# Patient Record
Sex: Female | Born: 1935 | Race: White | Hispanic: No | Smoking: Never smoker
Health system: Southern US, Community
[De-identification: ages and names within clinical notes are randomized; demographics above are authoritative.]

## PROBLEM LIST (undated history)

## (undated) DIAGNOSIS — C801 Malignant (primary) neoplasm, unspecified: Secondary | ICD-10-CM

## (undated) DIAGNOSIS — F32A Depression, unspecified: Secondary | ICD-10-CM

## (undated) DIAGNOSIS — Z8659 Personal history of other mental and behavioral disorders: Secondary | ICD-10-CM

## (undated) DIAGNOSIS — F0390 Unspecified dementia without behavioral disturbance: Secondary | ICD-10-CM

## (undated) DIAGNOSIS — F329 Major depressive disorder, single episode, unspecified: Secondary | ICD-10-CM

## (undated) DIAGNOSIS — H409 Unspecified glaucoma: Secondary | ICD-10-CM

## (undated) DIAGNOSIS — C73 Malignant neoplasm of thyroid gland: Secondary | ICD-10-CM

## (undated) DIAGNOSIS — R002 Palpitations: Secondary | ICD-10-CM

## (undated) DIAGNOSIS — E079 Disorder of thyroid, unspecified: Secondary | ICD-10-CM

## (undated) DIAGNOSIS — Z8619 Personal history of other infectious and parasitic diseases: Secondary | ICD-10-CM

## (undated) HISTORY — DX: Malignant neoplasm of thyroid gland: C73

## (undated) HISTORY — PX: APPENDECTOMY: SHX54

## (undated) HISTORY — DX: Unspecified glaucoma: H40.9

## (undated) HISTORY — DX: Disorder of thyroid, unspecified: E07.9

## (undated) HISTORY — PX: THYROIDECTOMY: SHX17

## (undated) HISTORY — DX: Personal history of other infectious and parasitic diseases: Z86.19

## (undated) HISTORY — DX: Malignant (primary) neoplasm, unspecified: C80.1

## (undated) HISTORY — DX: Palpitations: R00.2

## (undated) HISTORY — DX: Personal history of other mental and behavioral disorders: Z86.59

## (undated) HISTORY — DX: Major depressive disorder, single episode, unspecified: F32.9

## (undated) HISTORY — PX: ABDOMINAL HYSTERECTOMY: SHX81

## (undated) HISTORY — DX: Depression, unspecified: F32.A

---
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
## 1968-06-22 HISTORY — PX: LAPAROSCOPIC HYSTERECTOMY: SHX1926

## 2004-04-08 ENCOUNTER — Ambulatory Visit: Payer: Self-pay | Admitting: Internal Medicine

## 2005-01-22 ENCOUNTER — Ambulatory Visit: Payer: Self-pay | Admitting: Internal Medicine

## 2005-07-17 ENCOUNTER — Ambulatory Visit: Payer: Self-pay | Admitting: Unknown Physician Specialty

## 2006-03-04 ENCOUNTER — Ambulatory Visit: Payer: Self-pay | Admitting: Internal Medicine

## 2006-04-22 ENCOUNTER — Other Ambulatory Visit: Payer: Self-pay

## 2006-04-22 ENCOUNTER — Ambulatory Visit: Payer: Self-pay | Admitting: General Surgery

## 2006-05-05 ENCOUNTER — Inpatient Hospital Stay: Payer: Self-pay | Admitting: Surgery

## 2006-06-04 ENCOUNTER — Inpatient Hospital Stay: Payer: Self-pay | Admitting: Surgery

## 2006-08-06 ENCOUNTER — Ambulatory Visit: Payer: Self-pay | Admitting: Internal Medicine

## 2006-08-21 ENCOUNTER — Ambulatory Visit: Payer: Self-pay | Admitting: Internal Medicine

## 2006-08-30 ENCOUNTER — Ambulatory Visit: Payer: Self-pay | Admitting: Internal Medicine

## 2006-09-13 ENCOUNTER — Ambulatory Visit: Payer: Self-pay | Admitting: Internal Medicine

## 2006-09-21 ENCOUNTER — Ambulatory Visit: Payer: Self-pay | Admitting: Internal Medicine

## 2006-10-21 ENCOUNTER — Ambulatory Visit: Payer: Self-pay | Admitting: Internal Medicine

## 2006-12-21 ENCOUNTER — Ambulatory Visit: Payer: Self-pay | Admitting: Internal Medicine

## 2006-12-22 ENCOUNTER — Ambulatory Visit: Payer: Self-pay | Admitting: Internal Medicine

## 2007-01-21 ENCOUNTER — Ambulatory Visit: Payer: Self-pay | Admitting: Internal Medicine

## 2007-03-08 ENCOUNTER — Ambulatory Visit: Payer: Self-pay | Admitting: Internal Medicine

## 2007-03-10 ENCOUNTER — Ambulatory Visit: Payer: Self-pay | Admitting: Internal Medicine

## 2007-05-23 ENCOUNTER — Ambulatory Visit: Payer: Self-pay | Admitting: Internal Medicine

## 2007-06-20 ENCOUNTER — Ambulatory Visit: Payer: Self-pay | Admitting: Internal Medicine

## 2007-06-23 ENCOUNTER — Ambulatory Visit: Payer: Self-pay | Admitting: Internal Medicine

## 2007-09-06 ENCOUNTER — Ambulatory Visit: Payer: Self-pay | Admitting: Internal Medicine

## 2007-09-21 ENCOUNTER — Ambulatory Visit: Payer: Self-pay | Admitting: Internal Medicine

## 2007-11-08 ENCOUNTER — Ambulatory Visit: Payer: Self-pay | Admitting: Internal Medicine

## 2007-11-08 ENCOUNTER — Ambulatory Visit: Payer: Self-pay

## 2007-11-21 ENCOUNTER — Ambulatory Visit: Payer: Self-pay | Admitting: Internal Medicine

## 2007-12-21 ENCOUNTER — Ambulatory Visit: Payer: Self-pay | Admitting: Internal Medicine

## 2008-01-04 ENCOUNTER — Ambulatory Visit: Payer: Self-pay | Admitting: Internal Medicine

## 2008-01-21 ENCOUNTER — Ambulatory Visit: Payer: Self-pay | Admitting: Internal Medicine

## 2008-04-22 ENCOUNTER — Ambulatory Visit: Payer: Self-pay | Admitting: Internal Medicine

## 2008-04-25 ENCOUNTER — Ambulatory Visit: Payer: Self-pay | Admitting: Internal Medicine

## 2008-05-02 ENCOUNTER — Ambulatory Visit: Payer: Self-pay | Admitting: Internal Medicine

## 2008-05-22 ENCOUNTER — Ambulatory Visit: Payer: Self-pay | Admitting: Internal Medicine

## 2008-08-20 ENCOUNTER — Ambulatory Visit: Payer: Self-pay | Admitting: Internal Medicine

## 2008-08-29 ENCOUNTER — Ambulatory Visit: Payer: Self-pay | Admitting: Internal Medicine

## 2008-09-20 ENCOUNTER — Ambulatory Visit: Payer: Self-pay | Admitting: Internal Medicine

## 2008-12-17 ENCOUNTER — Ambulatory Visit: Payer: Self-pay | Admitting: Internal Medicine

## 2008-12-20 ENCOUNTER — Ambulatory Visit: Payer: Self-pay | Admitting: Internal Medicine

## 2009-04-22 ENCOUNTER — Ambulatory Visit: Payer: Self-pay | Admitting: Internal Medicine

## 2009-04-24 ENCOUNTER — Ambulatory Visit: Payer: Self-pay | Admitting: Internal Medicine

## 2009-05-09 ENCOUNTER — Ambulatory Visit: Payer: Self-pay | Admitting: Internal Medicine

## 2009-05-22 ENCOUNTER — Ambulatory Visit: Payer: Self-pay | Admitting: Internal Medicine

## 2009-12-20 ENCOUNTER — Ambulatory Visit: Payer: Self-pay | Admitting: Internal Medicine

## 2010-01-20 ENCOUNTER — Ambulatory Visit: Payer: Self-pay | Admitting: Internal Medicine

## 2010-04-23 ENCOUNTER — Ambulatory Visit: Payer: Self-pay | Admitting: Internal Medicine

## 2010-05-22 ENCOUNTER — Ambulatory Visit: Payer: Self-pay | Admitting: Internal Medicine

## 2010-06-25 ENCOUNTER — Ambulatory Visit: Payer: Self-pay | Admitting: Unknown Physician Specialty

## 2010-07-03 ENCOUNTER — Ambulatory Visit: Payer: Self-pay | Admitting: Internal Medicine

## 2010-07-16 ENCOUNTER — Ambulatory Visit: Payer: Self-pay | Admitting: Ophthalmology

## 2010-08-20 ENCOUNTER — Ambulatory Visit: Payer: Self-pay | Admitting: Ophthalmology

## 2010-08-22 ENCOUNTER — Ambulatory Visit: Payer: Self-pay | Admitting: Internal Medicine

## 2010-09-21 ENCOUNTER — Ambulatory Visit: Payer: Self-pay | Admitting: Internal Medicine

## 2010-12-23 ENCOUNTER — Ambulatory Visit: Payer: Self-pay | Admitting: Internal Medicine

## 2011-01-21 ENCOUNTER — Ambulatory Visit: Payer: Self-pay | Admitting: Internal Medicine

## 2011-04-24 ENCOUNTER — Ambulatory Visit: Payer: Self-pay | Admitting: Internal Medicine

## 2011-05-23 ENCOUNTER — Ambulatory Visit: Payer: Self-pay | Admitting: Internal Medicine

## 2011-06-09 LAB — BASIC METABOLIC PANEL
BUN: 17 mg/dL (ref 4–21)
CREATININE: 0.8 mg/dL (ref 0.5–1.1)
Glucose: 95 mg/dL
Potassium: 4.6 mmol/L (ref 3.4–5.3)
SODIUM: 141 mmol/L (ref 137–147)

## 2011-06-09 LAB — LIPID PANEL
Cholesterol: 217 mg/dL — AB (ref 0–200)
HDL: 64 mg/dL (ref 35–70)
LDL Cholesterol: 139 mg/dL
LDL/HDL RATIO: 3.4
Triglycerides: 71 mg/dL (ref 40–160)

## 2011-06-09 LAB — HEPATIC FUNCTION PANEL
ALT: 10 U/L (ref 7–35)
AST: 13 U/L (ref 13–35)
Alkaline Phosphatase: 49 U/L (ref 25–125)
BILIRUBIN DIRECT: 0.1 mg/dL (ref 0.01–0.4)
Bilirubin, Total: 0.5 mg/dL

## 2011-07-22 ENCOUNTER — Ambulatory Visit: Payer: Self-pay | Admitting: Internal Medicine

## 2011-07-22 LAB — HM MAMMOGRAPHY: HM Mammogram: NEGATIVE

## 2011-08-21 ENCOUNTER — Ambulatory Visit: Payer: Self-pay | Admitting: Internal Medicine

## 2011-08-21 LAB — TSH: Thyroid Stimulating Horm: 0.83 u[IU]/mL

## 2011-09-21 ENCOUNTER — Ambulatory Visit: Payer: Self-pay | Admitting: Internal Medicine

## 2011-12-22 DIAGNOSIS — R5383 Other fatigue: Secondary | ICD-10-CM | POA: Insufficient documentation

## 2011-12-25 ENCOUNTER — Other Ambulatory Visit: Payer: Self-pay | Admitting: Internal Medicine

## 2011-12-25 ENCOUNTER — Ambulatory Visit: Payer: Self-pay | Admitting: Internal Medicine

## 2011-12-25 LAB — CBC WITH DIFFERENTIAL/PLATELET
Basophil #: 0.1 10*3/uL (ref 0.0–0.1)
Eosinophil #: 0.2 10*3/uL (ref 0.0–0.7)
Eosinophil %: 3 %
Lymphocyte #: 2.2 10*3/uL (ref 1.0–3.6)
Lymphocyte %: 31 %
MCH: 30.7 pg (ref 26.0–34.0)
MCHC: 33.4 g/dL (ref 32.0–36.0)
MCV: 92 fL (ref 80–100)
Monocyte #: 0.5 x10 3/mm (ref 0.2–0.9)
Monocyte %: 7 %
Neutrophil %: 58.1 %
Platelet: 239 10*3/uL (ref 150–440)
RDW: 13.8 % (ref 11.5–14.5)
WBC: 6.9 10*3/uL (ref 3.6–11.0)

## 2011-12-25 LAB — COMPREHENSIVE METABOLIC PANEL
Albumin: 3.8 g/dL (ref 3.4–5.0)
Alkaline Phosphatase: 70 U/L (ref 50–136)
BUN: 13 mg/dL (ref 7–18)
Calcium, Total: 9 mg/dL (ref 8.5–10.1)
Chloride: 101 mmol/L (ref 98–107)
Creatinine: 0.79 mg/dL (ref 0.60–1.30)
EGFR (Non-African Amer.): >60
Glucose: 95 mg/dL (ref 65–99)
Potassium: 3.9 mmol/L (ref 3.5–5.1)
Sodium: 137 mmol/L (ref 136–145)

## 2012-01-21 ENCOUNTER — Ambulatory Visit: Payer: Self-pay | Admitting: Internal Medicine

## 2012-03-23 DIAGNOSIS — B0229 Other postherpetic nervous system involvement: Secondary | ICD-10-CM | POA: Insufficient documentation

## 2012-04-22 ENCOUNTER — Ambulatory Visit: Payer: Self-pay | Admitting: Internal Medicine

## 2012-04-22 LAB — CREATININE, SERUM
Creatinine: 0.92 mg/dL (ref 0.60–1.30)
EGFR (African American): >60
EGFR (Non-African Amer.): 59 — ABNORMAL LOW

## 2012-04-22 LAB — CBC CANCER CENTER
Basophil #: 0.1 x10 3/mm (ref 0.0–0.1)
Lymphocyte #: 2.7 x10 3/mm (ref 1.0–3.6)
Lymphocyte %: 33 %
MCH: 29.9 pg (ref 26.0–34.0)
MCV: 94 fL (ref 80–100)
Monocyte #: 0.7 x10 3/mm (ref 0.2–0.9)
Monocyte %: 8.2 %
Neutrophil %: 56.1 %
Platelet: 253 x10 3/mm (ref 150–440)
RDW: 15.4 % — ABNORMAL HIGH (ref 11.5–14.5)
WBC: 8.2 x10 3/mm (ref 3.6–11.0)

## 2012-05-22 ENCOUNTER — Ambulatory Visit: Payer: Self-pay | Admitting: Internal Medicine

## 2012-06-30 ENCOUNTER — Ambulatory Visit: Payer: Self-pay | Admitting: Internal Medicine

## 2012-07-28 DIAGNOSIS — M169 Osteoarthritis of hip, unspecified: Secondary | ICD-10-CM | POA: Insufficient documentation

## 2012-08-08 ENCOUNTER — Other Ambulatory Visit: Payer: Self-pay | Admitting: Internal Medicine

## 2012-08-08 LAB — CBC WITH DIFFERENTIAL/PLATELET
Basophil #: 0.1 10*3/uL (ref 0.0–0.1)
Basophil %: 0.8 %
Eosinophil #: 0.1 10*3/uL (ref 0.0–0.7)
HCT: 37 % (ref 35.0–47.0)
HGB: 12.2 g/dL (ref 12.0–16.0)
Lymphocyte #: 2 10*3/uL (ref 1.0–3.6)
Lymphocyte %: 30.6 %
MCH: 30.2 pg (ref 26.0–34.0)
MCHC: 32.9 g/dL (ref 32.0–36.0)
MCV: 92 fL (ref 80–100)
Monocyte #: 0.6 x10 3/mm (ref 0.2–0.9)
Neutrophil #: 3.9 10*3/uL (ref 1.4–6.5)
Neutrophil %: 58.6 %
Platelet: 253 10*3/uL (ref 150–440)
RDW: 14.4 % (ref 11.5–14.5)

## 2012-08-08 LAB — COMPREHENSIVE METABOLIC PANEL
Albumin: 3.5 g/dL (ref 3.4–5.0)
Anion Gap: 5 — ABNORMAL LOW (ref 7–16)
BUN: 18 mg/dL (ref 7–18)
Bilirubin,Total: 0.4 mg/dL (ref 0.2–1.0)
Calcium, Total: 8.9 mg/dL (ref 8.5–10.1)
Chloride: 104 mmol/L (ref 98–107)
Co2: 32 mmol/L (ref 21–32)
EGFR (African American): >60
EGFR (Non-African Amer.): >60
Osmolality: 283 (ref 275–301)
Potassium: 3.8 mmol/L (ref 3.5–5.1)
SGOT(AST): 17 U/L (ref 15–37)

## 2012-08-08 LAB — TSH: Thyroid Stimulating Horm: 0.952 u[IU]/mL

## 2013-01-18 ENCOUNTER — Encounter: Payer: Self-pay | Admitting: Internal Medicine

## 2013-01-20 ENCOUNTER — Encounter: Payer: Self-pay | Admitting: Internal Medicine

## 2013-01-23 ENCOUNTER — Telehealth: Payer: Self-pay | Admitting: Internal Medicine

## 2013-01-23 NOTE — Telephone Encounter (Signed)
I am having difficulty getting my regular patients in for their physicals and follow ups and especially for their acute problems.   I am unable to prescribe meds or write orders - prior to pts being seen.   Please notify PT of this information.  Thanks.

## 2013-01-23 NOTE — Telephone Encounter (Signed)
Pt is scheduled with Dr. Lorin Picket 09/2013 (due to 1 new Medicare pt monthly, appt made in May 2014). Pt previously saw Dr. Alison Murray.[facility] PT calling stating they will need an order to continue her PT before April 2015. Treated by [facility] PT for osteoarthritis, pain in joints, difficulty walking.  Pt will also need meds refilled.  Pt advised that she would need to see walk-in clinic for acute issues.  PT asking if there is anything we can do to continue this 79yo patient's care sooner than 09/2013.

## 2013-01-25 NOTE — Telephone Encounter (Signed)
I spoke with Harriett Sine and gave her the below information. She completely understands that Dr Lorin Picket is full at this time.  She will contact patient I did offer her an appointment with Raquel she will get back with [place] on that. She understands that we can't prescribe orders or meds prior to patient being seen.

## 2013-02-20 ENCOUNTER — Encounter: Payer: Self-pay | Admitting: Internal Medicine

## 2013-02-24 ENCOUNTER — Encounter: Payer: Self-pay | Admitting: Internal Medicine

## 2013-02-27 ENCOUNTER — Encounter: Payer: Self-pay | Admitting: *Deleted

## 2013-09-26 ENCOUNTER — Encounter: Payer: Self-pay | Admitting: Internal Medicine

## 2013-09-26 ENCOUNTER — Ambulatory Visit (INDEPENDENT_AMBULATORY_CARE_PROVIDER_SITE_OTHER): Payer: Medicare Other | Admitting: Internal Medicine

## 2013-09-26 ENCOUNTER — Encounter (INDEPENDENT_AMBULATORY_CARE_PROVIDER_SITE_OTHER): Payer: Self-pay

## 2013-09-26 VITALS — BP 110/70 | HR 75 | Temp 98.1°F | Ht 61.0 in | Wt 117.5 lb

## 2013-09-26 DIAGNOSIS — C73 Malignant neoplasm of thyroid gland: Secondary | ICD-10-CM

## 2013-09-26 DIAGNOSIS — M25551 Pain in right hip: Secondary | ICD-10-CM

## 2013-09-26 DIAGNOSIS — R51 Headache: Secondary | ICD-10-CM

## 2013-09-26 DIAGNOSIS — R002 Palpitations: Secondary | ICD-10-CM

## 2013-09-26 DIAGNOSIS — F32A Depression, unspecified: Secondary | ICD-10-CM

## 2013-09-26 DIAGNOSIS — Z13220 Encounter for screening for lipoid disorders: Secondary | ICD-10-CM

## 2013-09-26 DIAGNOSIS — F3289 Other specified depressive episodes: Secondary | ICD-10-CM

## 2013-09-26 DIAGNOSIS — M25559 Pain in unspecified hip: Secondary | ICD-10-CM

## 2013-09-26 DIAGNOSIS — R519 Headache, unspecified: Secondary | ICD-10-CM

## 2013-09-26 DIAGNOSIS — F329 Major depressive disorder, single episode, unspecified: Secondary | ICD-10-CM

## 2013-09-26 DIAGNOSIS — H409 Unspecified glaucoma: Secondary | ICD-10-CM

## 2013-09-26 NOTE — Progress Notes (Signed)
Pre-visit discussion using our clinic review tool. No additional management support is needed unless otherwise documented below in the visit note.  

## 2013-10-01 ENCOUNTER — Encounter: Payer: Self-pay | Admitting: Internal Medicine

## 2013-10-01 DIAGNOSIS — C73 Malignant neoplasm of thyroid gland: Secondary | ICD-10-CM | POA: Insufficient documentation

## 2013-10-01 DIAGNOSIS — F329 Major depressive disorder, single episode, unspecified: Secondary | ICD-10-CM | POA: Insufficient documentation

## 2013-10-01 DIAGNOSIS — H409 Unspecified glaucoma: Secondary | ICD-10-CM | POA: Insufficient documentation

## 2013-10-01 DIAGNOSIS — R51 Headache: Secondary | ICD-10-CM

## 2013-10-01 DIAGNOSIS — R519 Headache, unspecified: Secondary | ICD-10-CM | POA: Insufficient documentation

## 2013-10-01 DIAGNOSIS — M25559 Pain in unspecified hip: Secondary | ICD-10-CM | POA: Insufficient documentation

## 2013-10-01 DIAGNOSIS — M25551 Pain in right hip: Secondary | ICD-10-CM | POA: Insufficient documentation

## 2013-10-01 DIAGNOSIS — R002 Palpitations: Secondary | ICD-10-CM | POA: Insufficient documentation

## 2013-10-01 DIAGNOSIS — F32A Depression, unspecified: Secondary | ICD-10-CM | POA: Insufficient documentation

## 2013-10-01 NOTE — Assessment & Plan Note (Signed)
Had holter.  No significant arrhythmia.  Sees Dr Saralyn Pilar.  Stable.

## 2013-10-01 NOTE — Assessment & Plan Note (Signed)
On thyroid replacement now.  Follow.  Check tsh.

## 2013-10-01 NOTE — Assessment & Plan Note (Signed)
Describes over the last 2-3 weeks noticing a shooting pain - right side.  Only lasts for seconds.  No headache. Offered further testing.  She declines at this time.  Wants to follow.  Will check ESR.

## 2013-10-01 NOTE — Assessment & Plan Note (Signed)
On celexa and doing well.  Follow.

## 2013-10-01 NOTE — Assessment & Plan Note (Signed)
Sees opthalmology.  On drops.    

## 2013-10-01 NOTE — Progress Notes (Signed)
Subjective:    Patient ID: Paige Farmer, female    DOB: 1931/03/12, 80 y.o.   MRN: [id number]  HPI 80 year old female with past history of palpitations, glaucoma, depression and thyroid cancer.  She comes in today to follow up on these issues as well as to establish care.  She is a former pt of Dr Heber .  States overall she feels she is doing well.  Does report some occasional fluttering.  Sees Dr Saralyn Pilar every 6 months.  Has had a holter.  She uses drops for her glaucoma.  Sees opthalmology regularly.  On celexa 20mg  q day.  No significant depression symptoms.  Eating and drinking well.  She does report noticing a sharp pain - right side of her head.  Is intermittent.  Started 2-3 weeks ago.  Only last for second or two then resolves.  No headache.  No chest pain or tightness.  Breathing stable.  Does report some minimal constipation.  Takes a stool softener.  Controls.  Some right hip discomfort at times.  States has been told has arthritis.     Past Medical History  Diagnosis Date  . H/O: depression   . History of chicken pox   . Glaucoma   . Thyroid cancer   . Depression   . Thyroid disease   . Palpitations     Outpatient Encounter Prescriptions as of 09/26/2013  Medication Sig  . aspirin EC 81 MG tablet Take 81 mg by mouth daily.  . Calcium Carb-Cholecalciferol (CALCIUM 600 + D PO) Take by mouth 2 (two) times daily.  Marland Kitchen levothyroxine (SYNTHROID, LEVOTHROID) 75 MCG tablet Take 75 mcg by mouth daily before breakfast.  . liothyronine (CYTOMEL) 5 MCG tablet Take 5 mcg by mouth daily.  . Multiple Vitamins-Minerals (CENTRUM) tablet Take 1 tablet by mouth daily.  . Multiple Vitamins-Minerals (PRESERVISION/LUTEIN) CAPS Take by mouth 2 (two) times daily.  . timolol (BETIMOL) 0.5 % ophthalmic solution Place 1 drop into both eyes 2 (two) times daily.    Review of Systems Patient denies any headache, lightheadedness or dizziness.  Does report the sharp head pain as outlined.  No vision  change.  No chest pain, tightness or significant palpitations.  Occasionally may notice a few palpitations.  No increased shortness of breath, cough or congestion.  No nausea or vomiting.  No abdominal pain or cramping.  No bowel change, such as diarrhea, BRBPR or melana.  Some minimal constipation.  Takes stool softener.  No urine change.  Right hip pain as outlined.  Doing well on celexa.         Objective:   Physical Exam Filed Vitals:   09/26/13 1440  BP: 110/70  Pulse: 75  Temp: 98.1 F (36.7 C)   Blood pressure recheck:  120/62, pulse 14  80 year old female in no acute distress.   HEENT:  Nares- clear.  Oropharynx - without lesions. NECK:  Supple.  Nontender.  No audible bruit.  HEART:  Appears to be regular. LUNGS:  No crackles or wheezing audible.  Respirations even and unlabored.  RADIAL PULSE:  Equal bilaterally.  ABDOMEN:  Soft, nontender.  Bowel sounds present and normal.  No audible abdominal bruit.   EXTREMITIES:  No increased edema present.  DP pulses palpable and equal bilaterally.          Assessment & Plan:  HEALTH MAINTENANCE.  Schedule her for a physical.  Had a colonoscopy just over one year ago.  Up to date with  mammogram.  Obtain records.    I spent 45 minutes with the patient and more than 50% of the time was spent in consultation regarding the above.

## 2013-10-01 NOTE — Assessment & Plan Note (Signed)
Tylenol as directed.  Follow.  Does not limit her walking.  Will notify me if persistent.

## 2013-10-05 ENCOUNTER — Encounter: Payer: Self-pay | Admitting: Internal Medicine

## 2013-10-05 ENCOUNTER — Other Ambulatory Visit (INDEPENDENT_AMBULATORY_CARE_PROVIDER_SITE_OTHER): Payer: Medicare Other

## 2013-10-05 DIAGNOSIS — C73 Malignant neoplasm of thyroid gland: Secondary | ICD-10-CM

## 2013-10-05 DIAGNOSIS — R002 Palpitations: Secondary | ICD-10-CM

## 2013-10-05 DIAGNOSIS — Z136 Encounter for screening for cardiovascular disorders: Secondary | ICD-10-CM

## 2013-10-05 DIAGNOSIS — Z13220 Encounter for screening for lipoid disorders: Secondary | ICD-10-CM

## 2013-10-05 LAB — CBC WITH DIFFERENTIAL/PLATELET
Basophils Absolute: 0.1 10*3/uL (ref 0.0–0.1)
Basophils Relative: 0.8 % (ref 0.0–3.0)
EOS PCT: 3.1 % (ref 0.0–5.0)
Eosinophils Absolute: 0.2 10*3/uL (ref 0.0–0.7)
HEMATOCRIT: 36.2 % (ref 36.0–46.0)
Hemoglobin: 11.8 g/dL — ABNORMAL LOW (ref 12.0–15.0)
LYMPHS ABS: 2.9 10*3/uL (ref 0.7–4.0)
Lymphocytes Relative: 41.1 % (ref 12.0–46.0)
MCHC: 32.7 g/dL (ref 30.0–36.0)
MCV: 91.6 fl (ref 78.0–100.0)
Monocytes Absolute: 0.6 10*3/uL (ref 0.1–1.0)
Monocytes Relative: 8.8 % (ref 3.0–12.0)
Neutro Abs: 3.3 10*3/uL (ref 1.4–7.7)
Neutrophils Relative %: 46.2 % (ref 43.0–77.0)
PLATELETS: 251 10*3/uL (ref 150.0–400.0)
RBC: 3.96 Mil/uL (ref 3.87–5.11)
RDW: 14.4 % (ref 11.5–14.6)
WBC: 7.2 10*3/uL (ref 4.5–10.5)

## 2013-10-05 LAB — COMPREHENSIVE METABOLIC PANEL
ALK PHOS: 51 U/L (ref 39–117)
ALT: 16 U/L (ref 0–35)
AST: 19 U/L (ref 0–37)
Albumin: 3.7 g/dL (ref 3.5–5.2)
BUN: 16 mg/dL (ref 6–23)
CO2: 31 mEq/L (ref 19–32)
CREATININE: 0.7 mg/dL (ref 0.4–1.2)
Calcium: 9.3 mg/dL (ref 8.4–10.5)
Chloride: 100 mEq/L (ref 96–112)
GFR: 82.63 mL/min (ref >60.00)
GLUCOSE: 93 mg/dL (ref 70–99)
Potassium: 4.2 mEq/L (ref 3.5–5.1)
SODIUM: 137 meq/L (ref 135–145)
TOTAL PROTEIN: 6.2 g/dL (ref 6.0–8.3)
Total Bilirubin: 0.7 mg/dL (ref 0.3–1.2)

## 2013-10-05 LAB — LIPID PANEL
CHOLESTEROL: 219 mg/dL — AB (ref 0–200)
HDL: 65.5 mg/dL (ref >39.00)
LDL Cholesterol: 145 mg/dL — ABNORMAL HIGH (ref 0–99)
TRIGLYCERIDES: 42 mg/dL (ref 0.0–149.0)
Total CHOL/HDL Ratio: 3
VLDL: 8.4 mg/dL (ref 0.0–40.0)

## 2013-10-05 LAB — TSH: TSH: 0.33 u[IU]/mL — AB (ref 0.35–5.50)

## 2013-10-06 ENCOUNTER — Other Ambulatory Visit: Payer: Self-pay | Admitting: Internal Medicine

## 2013-10-06 ENCOUNTER — Encounter: Payer: Self-pay | Admitting: Internal Medicine

## 2013-10-06 DIAGNOSIS — D649 Anemia, unspecified: Secondary | ICD-10-CM

## 2013-10-06 NOTE — Progress Notes (Signed)
Orders placed for f/u labs.  

## 2013-10-09 ENCOUNTER — Telehealth: Payer: Self-pay | Admitting: Internal Medicine

## 2013-10-09 ENCOUNTER — Other Ambulatory Visit: Payer: Self-pay | Admitting: *Deleted

## 2013-10-09 MED ORDER — LEVOTHYROXINE SODIUM 50 MCG PO TABS: 50.0000 ug | ORAL_TABLET | Freq: Every day | ORAL | Status: DC

## 2013-10-09 NOTE — Telephone Encounter (Signed)
I sent her a my chart message regarding this. She is calling back.   I do not know when she got these vaccines, so I am not sure which ones are due.  If she knows dates of when she had her previous vaccines and colonoscopy, then we can arrange a nurse visit to get the ones she is needing.

## 2013-10-09 NOTE — Telephone Encounter (Signed)
Sent pt another mychart message

## 2013-10-09 NOTE — Telephone Encounter (Signed)
Please advise 

## 2013-10-09 NOTE — Telephone Encounter (Signed)
See below my chart message is it ok to make nurse visit  Appointment Request From: [name]      With Provider: Alisa Graff, MD [-Primary Care Physician-]      Preferred Date Range: From 10/06/2013 To 10/29/2013      Preferred Times: Monday Morning, Wednesday Morning, Friday Morning      Reason: To address the following health maintenance concerns.   Tetanus/Tdap   Colonoscopy   Zostavax   Pneumococcal Polysaccharide Vaccine Age 78 And Over      Comments:

## 2013-10-11 ENCOUNTER — Other Ambulatory Visit (INDEPENDENT_AMBULATORY_CARE_PROVIDER_SITE_OTHER): Payer: Medicare Other

## 2013-10-11 DIAGNOSIS — D649 Anemia, unspecified: Secondary | ICD-10-CM

## 2013-10-11 LAB — CBC WITH DIFFERENTIAL/PLATELET
BASOS ABS: 0.1 10*3/uL (ref 0.0–0.1)
Basophils Relative: 0.8 % (ref 0.0–3.0)
EOS ABS: 0.2 10*3/uL (ref 0.0–0.7)
Eosinophils Relative: 2.5 % (ref 0.0–5.0)
HCT: 34.6 % — ABNORMAL LOW (ref 36.0–46.0)
Hemoglobin: 11.5 g/dL — ABNORMAL LOW (ref 12.0–15.0)
LYMPHS ABS: 2.5 10*3/uL (ref 0.7–4.0)
Lymphocytes Relative: 32.4 % (ref 12.0–46.0)
MCHC: 33.2 g/dL (ref 30.0–36.0)
MCV: 91.3 fl (ref 78.0–100.0)
MONO ABS: 0.7 10*3/uL (ref 0.1–1.0)
Monocytes Relative: 9.6 % (ref 3.0–12.0)
NEUTROS PCT: 54.7 % (ref 43.0–77.0)
Neutro Abs: 4.2 10*3/uL (ref 1.4–7.7)
PLATELETS: 231 10*3/uL (ref 150.0–400.0)
RBC: 3.79 Mil/uL — ABNORMAL LOW (ref 3.87–5.11)
RDW: 14.7 % — AB (ref 11.5–14.6)
WBC: 7.7 10*3/uL (ref 4.5–10.5)

## 2013-10-11 LAB — IBC PANEL
Iron: 72 ug/dL (ref 42–145)
Saturation Ratios: 25 % (ref 20.0–50.0)
Transferrin: 205.9 mg/dL — ABNORMAL LOW (ref 212.0–360.0)

## 2013-10-11 LAB — VITAMIN B12: Vitamin B-12: 766 pg/mL (ref 211–911)

## 2013-10-11 LAB — FERRITIN: FERRITIN: 30.1 ng/mL (ref 10.0–291.0)

## 2013-10-12 ENCOUNTER — Telehealth: Payer: Self-pay | Admitting: Internal Medicine

## 2013-10-12 ENCOUNTER — Encounter: Payer: Self-pay | Admitting: Internal Medicine

## 2013-10-12 DIAGNOSIS — D649 Anemia, unspecified: Secondary | ICD-10-CM

## 2013-10-12 NOTE — Telephone Encounter (Signed)
Pt notified of lab results via my chart.  Needs a non fasting lab appt in 3-4 weeks.  Please schedule the lab appt and notify pt of appt date and tme.  Thanks.  Dr Nicki Reaper

## 2013-10-12 NOTE — Telephone Encounter (Signed)
Appointment 11/07/13  Sent my chart message letting pt know about appointment

## 2013-10-20 ENCOUNTER — Encounter: Payer: Self-pay | Admitting: Internal Medicine

## 2013-10-20 ENCOUNTER — Ambulatory Visit (INDEPENDENT_AMBULATORY_CARE_PROVIDER_SITE_OTHER)
Admission: RE | Admit: 2013-10-20 | Discharge: 2013-10-20 | Disposition: A | Discharge status: Home / Self Care | Payer: Medicare Other | Source: Ambulatory Visit | Attending: Internal Medicine | Admitting: Internal Medicine

## 2013-10-20 ENCOUNTER — Ambulatory Visit (INDEPENDENT_AMBULATORY_CARE_PROVIDER_SITE_OTHER): Payer: Medicare Other | Admitting: Internal Medicine

## 2013-10-20 VITALS — BP 130/70 | HR 64 | Temp 97.9°F | Ht 61.0 in | Wt 118.8 lb

## 2013-10-20 DIAGNOSIS — M25559 Pain in unspecified hip: Secondary | ICD-10-CM

## 2013-10-20 DIAGNOSIS — F3289 Other specified depressive episodes: Secondary | ICD-10-CM

## 2013-10-20 DIAGNOSIS — R519 Headache, unspecified: Secondary | ICD-10-CM

## 2013-10-20 DIAGNOSIS — M25551 Pain in right hip: Secondary | ICD-10-CM

## 2013-10-20 DIAGNOSIS — C73 Malignant neoplasm of thyroid gland: Secondary | ICD-10-CM

## 2013-10-20 DIAGNOSIS — F329 Major depressive disorder, single episode, unspecified: Secondary | ICD-10-CM

## 2013-10-20 DIAGNOSIS — F32A Depression, unspecified: Secondary | ICD-10-CM

## 2013-10-20 DIAGNOSIS — R51 Headache: Secondary | ICD-10-CM

## 2013-10-20 NOTE — Progress Notes (Signed)
Pre visit review using our clinic review tool, if applicable. No additional management support is needed unless otherwise documented below in the visit note. 

## 2013-10-22 ENCOUNTER — Encounter: Payer: Self-pay | Admitting: Internal Medicine

## 2013-10-22 NOTE — Assessment & Plan Note (Signed)
Persistent pain.  Will check right hip xray.  Further w/up pending results.

## 2013-10-22 NOTE — Assessment & Plan Note (Signed)
Not reported as a problem today.  Follow.   

## 2013-10-22 NOTE — Assessment & Plan Note (Signed)
On celexa and doing well.  Follow.   

## 2013-10-22 NOTE — Assessment & Plan Note (Signed)
On thyroid replacement now.  Follow.  Just adjusted her thyroid medication.  Check tsh in several weeks.

## 2013-10-22 NOTE — Progress Notes (Signed)
Subjective:    Patient ID: Paige Farmer, female    DOB: 08-29-1930, 80 y.o.   MRN: [id number]  HPI 80 year old female with past history of palpitations, glaucoma, depression and thyroid cancer.  She comes in today for a follow up.  States overall she feels she is doing well.  Sees Dr Saralyn Pilar every 6 months.  Has had a holter.  She uses drops for her glaucoma.  Sees opthalmology regularly.  On celexa 20mg  q day.  No significant depression symptoms.  Eating and drinking well.   No headache.  No head pain reported.  No chest pain or tightness.  Breathing stable.  Does report some minimal constipation.  Takes a stool softener.  Controls.  Has right hip pain.  Present for a couple of years.  Flares more at times.  If she lies down a certain way, ok.  Discussed her immunizations.  Do not have the records from Dr Heber [place].  Unsure when had her previous immunizations.     Past Medical History  Diagnosis Date  . H/O: depression   . History of chicken pox   . Glaucoma   . Thyroid cancer   . Depression   . Thyroid disease   . Palpitations     Outpatient Encounter Prescriptions as of 10/20/2013  Medication Sig  . aspirin EC 81 MG tablet Take 81 mg by mouth daily.  . Calcium Carb-Cholecalciferol (CALCIUM 600 + D PO) Take by mouth 2 (two) times daily.  Marland Kitchen levothyroxine (SYNTHROID, LEVOTHROID) 50 MCG tablet Take 1 tablet (50 mcg total) by mouth daily.  Marland Kitchen liothyronine (CYTOMEL) 5 MCG tablet Take 5 mcg by mouth daily.  . Multiple Vitamins-Minerals (CENTRUM) tablet Take 1 tablet by mouth daily.  . Multiple Vitamins-Minerals (PRESERVISION/LUTEIN) CAPS Take by mouth 2 (two) times daily.  . timolol (BETIMOL) 0.5 % ophthalmic solution Place 1 drop into both eyes 2 (two) times daily.    Review of Systems Patient denies any headache, lightheadedness or dizziness.  She reported no head pain.  No vision change.  No chest pain, tightness or significant palpitations.  No increased shortness of breath, cough  or congestion.  No nausea or vomiting.  No abdominal pain or cramping.  No bowel change, such as diarrhea, BRBPR or melana.  Some minimal constipation.  Takes stool softener.  No urine change.  Right hip pain as outlined.  Doing well on celexa.         Objective:   Physical Exam  Filed Vitals:   10/20/13 0917  BP: 130/70  Pulse: 64  Temp: 97.9 F (42.84 C)   80 year old female in no acute distress.   HEENT:  Nares- clear.  Oropharynx - without lesions. NECK:  Supple.  Nontender.  No audible bruit.  HEART:  Appears to be regular. LUNGS:  No crackles or wheezing audible.  Respirations even and unlabored.  RADIAL PULSE:  Equal bilaterally.  ABDOMEN:  Soft, nontender.  Bowel sounds present and normal.  No audible abdominal bruit.   EXTREMITIES:  No increased edema present.  DP pulses palpable and equal bilaterally.    MSK:  Minimal right lateral hip pain with full extension of her right leg.  Minimal pain with abduction and adduction right leg.       Assessment & Plan:  IMMUNIZATIONS.  Need outside records for review.    HEALTH MAINTENANCE.  Scheduled for a physical.  Had a colonoscopy just over one year ago.  Up to date with  mammogram.  Still need records.   I spent 25 minutes with the patient and more than 50% of the time was spent in consultation regarding the above.

## 2013-10-23 ENCOUNTER — Encounter: Payer: Self-pay | Admitting: Internal Medicine

## 2013-10-23 DIAGNOSIS — M25559 Pain in unspecified hip: Secondary | ICD-10-CM

## 2013-10-23 NOTE — Telephone Encounter (Signed)
Order placed for physical therapy referral.

## 2013-10-24 ENCOUNTER — Encounter: Payer: Self-pay | Admitting: Internal Medicine

## 2013-10-31 ENCOUNTER — Encounter: Payer: Self-pay | Admitting: Internal Medicine

## 2013-11-07 ENCOUNTER — Encounter: Payer: Self-pay | Admitting: Internal Medicine

## 2013-11-07 ENCOUNTER — Other Ambulatory Visit (INDEPENDENT_AMBULATORY_CARE_PROVIDER_SITE_OTHER): Payer: Medicare Other

## 2013-11-07 DIAGNOSIS — D649 Anemia, unspecified: Secondary | ICD-10-CM

## 2013-11-07 LAB — CBC WITH DIFFERENTIAL/PLATELET
Basophils Absolute: 0 10*3/uL (ref 0.0–0.1)
Basophils Relative: 0.5 % (ref 0.0–3.0)
EOS PCT: 2.8 % (ref 0.0–5.0)
Eosinophils Absolute: 0.2 10*3/uL (ref 0.0–0.7)
HCT: 34.6 % — ABNORMAL LOW (ref 36.0–46.0)
Hemoglobin: 11.5 g/dL — ABNORMAL LOW (ref 12.0–15.0)
LYMPHS PCT: 36.1 % (ref 12.0–46.0)
Lymphs Abs: 2.5 10*3/uL (ref 0.7–4.0)
MCHC: 33.2 g/dL (ref 30.0–36.0)
MCV: 91.8 fl (ref 78.0–100.0)
MONO ABS: 0.5 10*3/uL (ref 0.1–1.0)
MONOS PCT: 7.3 % (ref 3.0–12.0)
NEUTROS PCT: 53.3 % (ref 43.0–77.0)
Neutro Abs: 3.7 10*3/uL (ref 1.4–7.7)
PLATELETS: 226 10*3/uL (ref 150.0–400.0)
RBC: 3.77 Mil/uL — ABNORMAL LOW (ref 3.87–5.11)
RDW: 14.5 % (ref 11.5–15.5)
WBC: 6.9 10*3/uL (ref 4.0–10.5)

## 2013-11-29 ENCOUNTER — Telehealth: Payer: Self-pay | Admitting: *Deleted

## 2013-11-29 MED ORDER — CITALOPRAM HYDROBROMIDE 20 MG PO TABS: 20.0000 mg | ORAL_TABLET | Freq: Every day | ORAL | Status: DC

## 2013-11-29 NOTE — Telephone Encounter (Signed)
If pt has been taking regularly - yes ok to refill x 4. For some reason,  I have this written in my note, but not on med list.  Thanks

## 2013-11-29 NOTE — Telephone Encounter (Signed)
Ok refill, not on med list? See [employer]

## 2013-11-29 NOTE — Telephone Encounter (Signed)
Sent mychart message, medication not currently on patient's list

## 2013-11-29 NOTE — Telephone Encounter (Signed)
Refill Request  Citalopram 20 mg

## 2013-11-29 NOTE — Telephone Encounter (Signed)
Rx sent to pharmacy by escript  

## 2013-12-19 ENCOUNTER — Encounter: Payer: Self-pay | Admitting: Internal Medicine

## 2013-12-29 ENCOUNTER — Ambulatory Visit (INDEPENDENT_AMBULATORY_CARE_PROVIDER_SITE_OTHER): Payer: Medicare Other | Admitting: Internal Medicine

## 2013-12-29 ENCOUNTER — Encounter: Payer: Self-pay | Admitting: Internal Medicine

## 2013-12-29 VITALS — BP 140/60 | HR 62 | Temp 98.5°F | Ht 61.5 in | Wt 119.2 lb

## 2013-12-29 DIAGNOSIS — R51 Headache: Secondary | ICD-10-CM

## 2013-12-29 DIAGNOSIS — H409 Unspecified glaucoma: Secondary | ICD-10-CM

## 2013-12-29 DIAGNOSIS — R519 Headache, unspecified: Secondary | ICD-10-CM

## 2013-12-29 DIAGNOSIS — K5900 Constipation, unspecified: Secondary | ICD-10-CM

## 2013-12-29 DIAGNOSIS — F32A Depression, unspecified: Secondary | ICD-10-CM

## 2013-12-29 DIAGNOSIS — E7800 Pure hypercholesterolemia, unspecified: Secondary | ICD-10-CM

## 2013-12-29 DIAGNOSIS — F3289 Other specified depressive episodes: Secondary | ICD-10-CM

## 2013-12-29 DIAGNOSIS — F329 Major depressive disorder, single episode, unspecified: Secondary | ICD-10-CM

## 2013-12-29 DIAGNOSIS — D649 Anemia, unspecified: Secondary | ICD-10-CM

## 2013-12-29 DIAGNOSIS — C73 Malignant neoplasm of thyroid gland: Secondary | ICD-10-CM

## 2013-12-29 MED ORDER — LEVOTHYROXINE SODIUM 50 MCG PO TABS: 50.0000 ug | ORAL_TABLET | Freq: Every day | ORAL | Status: DC

## 2013-12-29 MED ORDER — SERTRALINE HCL 50 MG PO TABS: 50.0000 mg | ORAL_TABLET | Freq: Every day | ORAL | Status: DC

## 2013-12-29 NOTE — Patient Instructions (Signed)
Take citalopram 20mg  1/2 tablet per day for one week and then stop.    Start zoloft 50mg  1/2 tablet per day for one week and then increase to one whole tablet per day.

## 2013-12-29 NOTE — Progress Notes (Signed)
Pre visit review using our clinic review tool, if applicable. No additional management support is needed unless otherwise documented below in the visit note. 

## 2014-01-04 ENCOUNTER — Encounter: Payer: Self-pay | Admitting: Internal Medicine

## 2014-01-04 DIAGNOSIS — K5900 Constipation, unspecified: Secondary | ICD-10-CM | POA: Insufficient documentation

## 2014-01-04 DIAGNOSIS — D649 Anemia, unspecified: Secondary | ICD-10-CM | POA: Insufficient documentation

## 2014-01-04 NOTE — Assessment & Plan Note (Signed)
Sees opthalmology.  On drops.

## 2014-01-04 NOTE — Progress Notes (Signed)
Subjective:    Patient ID: Paige Farmer, female    DOB: Nov 18, 1930, 80 y.o.   MRN: [id number]  HPI 80 year old female with past history of palpitations, glaucoma, depression and thyroid cancer.  She comes in today to follow up on these issues as well as for a complete physical exam.  States overall she feels she is doing well.  Sees Dr Saralyn Pilar every 6 months.  Has had a holter.  She uses drops for her glaucoma.  Sees opthalmology regularly.  On celexa 20mg  q day.  She is not sleeping as well.  Some increased stress and some increased depression (minimal).  Would like to change the citalopram to something different.   Eating and drinking well.   No headache.  No head pain reported.  No chest pain or tightness.  Breathing stable.  Does report was having minimal constipation.  Takes a stool softener bid and this is working for her.   Again discussed her immunizations.  Do not have the records from Dr Heber [place].  Unsure when had her previous immunizations.     Past Medical History  Diagnosis Date  . H/O: depression   . History of chicken pox   . Glaucoma   . Thyroid cancer   . Depression   . Thyroid disease   . Palpitations     Outpatient Encounter Prescriptions as of 12/29/2013  Medication Sig  . aspirin EC 81 MG tablet Take 81 mg by mouth daily.  . Calcium Carb-Cholecalciferol (CALCIUM 600 + D PO) Take by mouth 2 (two) times daily.  . citalopram (CELEXA) 20 MG tablet Take 1 tablet (20 mg total) by mouth daily.  Marland Kitchen levothyroxine (SYNTHROID, LEVOTHROID) 50 MCG tablet Take 1 tablet (50 mcg total) by mouth daily.  Marland Kitchen liothyronine (CYTOMEL) 5 MCG tablet Take 5 mcg by mouth daily.  . Multiple Vitamins-Minerals (CENTRUM) tablet Take 1 tablet by mouth daily.  . Multiple Vitamins-Minerals (PRESERVISION/LUTEIN) CAPS Take by mouth 2 (two) times daily.  . sertraline (ZOLOFT) 50 MG tablet Take 1 tablet (50 mg total) by mouth daily.  . timolol (BETIMOL) 0.5 % ophthalmic solution Place 1 drop into  both eyes 2 (two) times daily.  . [DISCONTINUED] levothyroxine (SYNTHROID, LEVOTHROID) 50 MCG tablet Take 1 tablet (50 mcg total) by mouth daily.    Review of Systems Patient denies any headache, lightheadedness or dizziness.  She reported no head pain.  No vision change.  No chest pain, tightness or significant palpitations.  No increased shortness of breath, cough or congestion.  No nausea or vomiting. No abdominal pain or cramping.  No bowel change, such as diarrhea, BRBPR or melana.  Some minimal constipation.  Takes stool softener.  Working.  No urine change.  Some increased stress and minimal increased depression.  Feels needs to change celexa.         Objective:   Physical Exam  Filed Vitals:   12/29/13 1331  BP: 140/60  Pulse: 62  Temp: 98.5 F (36.9 C)   Blood pressure recheck:  138/72, pulse 8  80 year old female in no acute distress.   HEENT:  Nares- clear.  Oropharynx - without lesions. NECK:  Supple.  Nontender.  No audible bruit.  HEART:  Appears to be regular. LUNGS:  No crackles or wheezing audible.  Respirations even and unlabored.  RADIAL PULSE:  Equal bilaterally.    BREASTS:  No nipple discharge or nipple retraction present.  Could not appreciate any distinct nodules or axillary adenopathy.  ABDOMEN:  Soft, nontender.  Bowel sounds present and normal.  No audible abdominal bruit.  GU:  Not performed.    EXTREMITIES:  No increased edema present.  DP pulses palpable and equal bilaterally.          Assessment & Plan:  IMMUNIZATIONS.  Still need outside records for review.    HEALTH MAINTENANCE.  Physical today.  Had a colonoscopy just over one year ago.  Schedule mammogram.    I spent 25 minutes with the patient and more than 50% of the time was spent in consultation regarding the above.

## 2014-01-04 NOTE — Assessment & Plan Note (Signed)
Recheck cbc and ferritin.   

## 2014-01-04 NOTE — Assessment & Plan Note (Signed)
Not reported as a problem today.  Follow.

## 2014-01-04 NOTE — Assessment & Plan Note (Signed)
Better on stool softeners bid.  Follow.

## 2014-01-04 NOTE — Assessment & Plan Note (Signed)
Increased stress and depression.  Discussed with her today.  She wants to change citalopram.  Will decrease citalopram to 10mg  q day and start zoloft 50mg  1/2 tablet q day.  Will overlap x 1 week and then stop citalopram and increase zoloft to 50mg  q day.  Follow closely.

## 2014-01-04 NOTE — Assessment & Plan Note (Signed)
On thyroid replacement.  Follow.  Just adjusted her thyroid medication.  Check tsh in several weeks.  May need to try and keep slightly suppressed given cancer history.

## 2014-01-25 ENCOUNTER — Other Ambulatory Visit: Payer: Self-pay | Admitting: *Deleted

## 2014-01-25 MED ORDER — LIOTHYRONINE SODIUM 5 MCG PO TABS: 5.0000 ug | ORAL_TABLET | Freq: Every day | ORAL | Status: DC

## 2014-02-21 ENCOUNTER — Other Ambulatory Visit: Payer: Self-pay | Admitting: Internal Medicine

## 2014-03-07 ENCOUNTER — Other Ambulatory Visit (INDEPENDENT_AMBULATORY_CARE_PROVIDER_SITE_OTHER): Payer: Medicare Other

## 2014-03-07 DIAGNOSIS — E7800 Pure hypercholesterolemia, unspecified: Secondary | ICD-10-CM

## 2014-03-07 DIAGNOSIS — D649 Anemia, unspecified: Secondary | ICD-10-CM

## 2014-03-07 DIAGNOSIS — C73 Malignant neoplasm of thyroid gland: Secondary | ICD-10-CM

## 2014-03-07 LAB — CBC WITH DIFFERENTIAL/PLATELET
BASOS ABS: 0 10*3/uL (ref 0.0–0.1)
Basophils Relative: 0.3 % (ref 0.0–3.0)
Eosinophils Absolute: 0.2 10*3/uL (ref 0.0–0.7)
Eosinophils Relative: 2.5 % (ref 0.0–5.0)
HCT: 37.5 % (ref 36.0–46.0)
Hemoglobin: 12.5 g/dL (ref 12.0–15.0)
LYMPHS PCT: 40.1 % (ref 12.0–46.0)
Lymphs Abs: 3.8 10*3/uL (ref 0.7–4.0)
MCHC: 33.4 g/dL (ref 30.0–36.0)
MCV: 92.1 fl (ref 78.0–100.0)
Monocytes Absolute: 0.6 10*3/uL (ref 0.1–1.0)
Monocytes Relative: 6.9 % (ref 3.0–12.0)
NEUTROS PCT: 50.2 % (ref 43.0–77.0)
Neutro Abs: 4.7 10*3/uL (ref 1.4–7.7)
PLATELETS: 248 10*3/uL (ref 150.0–400.0)
RBC: 4.07 Mil/uL (ref 3.87–5.11)
RDW: 15 % (ref 11.5–15.5)
WBC: 9.4 10*3/uL (ref 4.0–10.5)

## 2014-03-08 LAB — COMPREHENSIVE METABOLIC PANEL
ALK PHOS: 58 U/L (ref 39–117)
ALT: 23 U/L (ref 0–35)
AST: 24 U/L (ref 0–37)
Albumin: 4.2 g/dL (ref 3.5–5.2)
BUN: 14 mg/dL (ref 6–23)
CO2: 30 mEq/L (ref 19–32)
Calcium: 9.1 mg/dL (ref 8.4–10.5)
Chloride: 99 mEq/L (ref 96–112)
Creatinine, Ser: 1 mg/dL (ref 0.4–1.2)
GFR: 59.94 mL/min — ABNORMAL LOW (ref >60.00)
Glucose, Bld: 109 mg/dL — ABNORMAL HIGH (ref 70–99)
POTASSIUM: 3.7 meq/L (ref 3.5–5.1)
SODIUM: 136 meq/L (ref 135–145)
TOTAL PROTEIN: 7 g/dL (ref 6.0–8.3)
Total Bilirubin: 0.8 mg/dL (ref 0.2–1.2)

## 2014-03-08 LAB — LIPID PANEL
CHOL/HDL RATIO: 4
Cholesterol: 300 mg/dL — ABNORMAL HIGH (ref 0–200)
HDL: 78.3 mg/dL (ref >39.00)
LDL Cholesterol: 205 mg/dL — ABNORMAL HIGH (ref 0–99)
NONHDL: 221.7
Triglycerides: 84 mg/dL (ref 0.0–149.0)
VLDL: 16.8 mg/dL (ref 0.0–40.0)

## 2014-03-08 LAB — FERRITIN: FERRITIN: 33.2 ng/mL (ref 10.0–291.0)

## 2014-03-08 LAB — TSH: TSH: 29.53 u[IU]/mL — ABNORMAL HIGH (ref 0.35–4.50)

## 2014-03-14 ENCOUNTER — Ambulatory Visit (INDEPENDENT_AMBULATORY_CARE_PROVIDER_SITE_OTHER): Payer: Medicare Other | Admitting: Internal Medicine

## 2014-03-14 ENCOUNTER — Encounter: Payer: Self-pay | Admitting: Internal Medicine

## 2014-03-14 ENCOUNTER — Other Ambulatory Visit: Payer: Self-pay | Admitting: Internal Medicine

## 2014-03-14 VITALS — BP 110/70 | HR 67 | Temp 98.3°F | Ht 61.5 in | Wt 119.8 lb

## 2014-03-14 DIAGNOSIS — C73 Malignant neoplasm of thyroid gland: Secondary | ICD-10-CM

## 2014-03-14 DIAGNOSIS — K5900 Constipation, unspecified: Secondary | ICD-10-CM

## 2014-03-14 DIAGNOSIS — F32A Depression, unspecified: Secondary | ICD-10-CM

## 2014-03-14 DIAGNOSIS — Z23 Encounter for immunization: Secondary | ICD-10-CM

## 2014-03-14 DIAGNOSIS — E7800 Pure hypercholesterolemia, unspecified: Secondary | ICD-10-CM

## 2014-03-14 DIAGNOSIS — F3289 Other specified depressive episodes: Secondary | ICD-10-CM

## 2014-03-14 DIAGNOSIS — D649 Anemia, unspecified: Secondary | ICD-10-CM

## 2014-03-14 DIAGNOSIS — R739 Hyperglycemia, unspecified: Secondary | ICD-10-CM

## 2014-03-14 DIAGNOSIS — F329 Major depressive disorder, single episode, unspecified: Secondary | ICD-10-CM

## 2014-03-14 DIAGNOSIS — Z1239 Encounter for other screening for malignant neoplasm of breast: Secondary | ICD-10-CM

## 2014-03-14 MED ORDER — LEVOTHYROXINE SODIUM 75 MCG PO TABS: 75.0000 ug | ORAL_TABLET | Freq: Every day | ORAL | Status: DC

## 2014-03-14 MED ORDER — ATORVASTATIN CALCIUM 10 MG PO TABS: 10.0000 mg | ORAL_TABLET | Freq: Every day | ORAL | Status: DC

## 2014-03-14 MED ORDER — SERTRALINE HCL 50 MG PO TABS: ORAL_TABLET | ORAL | Status: DC

## 2014-03-14 NOTE — Assessment & Plan Note (Signed)
On thyroid replacement.  TSH recently elevated.  Increase synthroid to 78mcg q day.  On cytomel.  Was seeing Dr Ma Hillock.  She has not seen him recently.  Will refer back to Dr Ma Hillock for further evaluation.  Recheck tsh in 4 weeks.

## 2014-03-14 NOTE — Assessment & Plan Note (Signed)
Hgb 03/07/14 - wnl.

## 2014-03-14 NOTE — Patient Instructions (Signed)
Stop synthroid 69mcg.  Start 77mcg synthroid per day.    Increase zoloft to 50mg  1 1/2 tablet per day.   Can try miralax for constipation.

## 2014-03-14 NOTE — Progress Notes (Signed)
Orders placed for f/u labs.  

## 2014-03-14 NOTE — Assessment & Plan Note (Signed)
Better on stool softeners bid.  Discussed miralax.

## 2014-03-14 NOTE — Assessment & Plan Note (Signed)
Increased stress and depression.  Discussed with her today.  On zoloft.  Will increase zoloft to 50mg  1 1/2 tablet per day.  Follow closely.  After discussion, will refer to psychiatry for further evaluation and treatment.

## 2014-03-14 NOTE — Assessment & Plan Note (Signed)
Low fat/low cholesterol diet.  Recent LDL elevated - 200.  Start lipitor 10mg  q day.  Follow.  Check liver panel in 6 weeks.

## 2014-03-14 NOTE — Progress Notes (Signed)
Subjective:    Patient ID: Paige Farmer, female    DOB: Jun 29, 1930, 80 y.o.   MRN: [id number]  HPI 80 year old female with past history of palpitations, glaucoma, depression and thyroid cancer.  She comes in today for a scheduled follow up.  States overall she feels she is doing well.  Sees Dr Saralyn Pilar every 6 months.  Has had a holter.  She uses drops for her glaucoma.  Sees opthalmology regularly.  She is still not sleeping well.  Some increased stress and some increased depression (minimal).  Was on citalopram.  Did not feel this helped.  Now on zoloft.  Stressed about her husbands health issues.  Eating and drinking well.   No headache.   No chest pain or tightness.  Breathing stable.  Does report was having minimal constipation.  Takes a stool softener bid and this is working relatively well for her.   We discussed using miralax.  We discussed her immunizations.  Also discussed her recent labs which includes increased TSH and increased cholesterol.      Past Medical History  Diagnosis Date  . H/O: depression   . History of chicken pox   . Glaucoma   . Thyroid cancer   . Depression   . Thyroid disease   . Palpitations     Outpatient Encounter Prescriptions as of 03/14/2014  Medication Sig  . aspirin EC 81 MG tablet Take 81 mg by mouth daily.  . Calcium Carb-Cholecalciferol (CALCIUM 600 + D PO) Take by mouth 2 (two) times daily.  Marland Kitchen liothyronine (CYTOMEL) 5 MCG tablet Take 7.5 mcg by mouth daily.  . Multiple Vitamins-Minerals (CENTRUM) tablet Take 1 tablet by mouth daily.  . Multiple Vitamins-Minerals (PRESERVISION/LUTEIN) CAPS Take by mouth 2 (two) times daily.  . sertraline (ZOLOFT) 50 MG tablet Take 1 1/2 tablet per day  . timolol (BETIMOL) 0.5 % ophthalmic solution Place 1 drop into both eyes 2 (two) times daily.  . [DISCONTINUED] levothyroxine (SYNTHROID, LEVOTHROID) 50 MCG tablet Take 1 tablet (50 mcg total) by mouth daily.  . [DISCONTINUED] liothyronine (CYTOMEL) 5 MCG  tablet Take 1 tablet (5 mcg total) by mouth daily.  . [DISCONTINUED] sertraline (ZOLOFT) 50 MG tablet TAKE 1 TABLET (50 MG TOTAL) BY MOUTH DAILY.  Marland Kitchen levothyroxine (SYNTHROID, LEVOTHROID) 75 MCG tablet Take 1 tablet (75 mcg total) by mouth daily.    Review of Systems Patient denies any headache, lightheadedness or dizziness.  No vision change.  No chest pain, tightness or palpitations.  No increased shortness of breath, cough or congestion.  No nausea or vomiting. No abdominal pain or cramping.  No bowel change, such as diarrhea, BRBPR or melana.  Some minimal constipation.  Takes stool softener.  Discussed miralax.  No urine change.  Some increased stress and minimal increased depression.  On zoloft now.  Discussed increasing.  Discussed cholesterol and treatment.           Objective:   Physical Exam  Filed Vitals:   03/14/14 1024  BP: 110/70  Pulse: 67  Temp: 98.3 F (36.8 C)   Blood pressure recheck:  24/59  80 year old female in no acute distress.   HEENT:  Nares- clear.  Oropharynx - without lesions. NECK:  Supple.  Nontender.  No audible bruit.  HEART:  Appears to be regular. LUNGS:  No crackles or wheezing audible.  Respirations even and unlabored.  RADIAL PULSE:  Equal bilaterally.   ABDOMEN:  Soft, nontender.  Bowel sounds present and  normal.  No audible abdominal bruit.    EXTREMITIES:  No increased edema present.  DP pulses palpable and equal bilaterally.          Assessment & Plan:  HEALTH MAINTENANCE.  Physical 12/29/13.  Had a colonoscopy just over one year ago.  Schedule mammogram.    I spent 25 minutes with the patient and more than 50% of the time was spent in consultation regarding the above.

## 2014-03-14 NOTE — Progress Notes (Signed)
Pre visit review using our clinic review tool, if applicable. No additional management support is needed unless otherwise documented below in the visit note. 

## 2014-03-22 ENCOUNTER — Other Ambulatory Visit: Payer: Self-pay | Admitting: Internal Medicine

## 2014-03-27 ENCOUNTER — Ambulatory Visit: Payer: Self-pay | Admitting: Internal Medicine

## 2014-03-27 LAB — CREATININE, SERUM
CREATININE: 0.85 mg/dL (ref 0.60–1.30)
EGFR (African American): >60

## 2014-03-27 LAB — T4, FREE: Free Thyroxine: 0.89 ng/dL (ref 0.76–1.46)

## 2014-04-12 ENCOUNTER — Ambulatory Visit: Payer: Self-pay | Admitting: Internal Medicine

## 2014-04-12 LAB — HM MAMMOGRAPHY: HM Mammogram: NEGATIVE

## 2014-04-13 ENCOUNTER — Encounter: Payer: Self-pay | Admitting: Internal Medicine

## 2014-04-22 ENCOUNTER — Ambulatory Visit: Payer: Self-pay | Admitting: Internal Medicine

## 2014-04-25 ENCOUNTER — Other Ambulatory Visit (INDEPENDENT_AMBULATORY_CARE_PROVIDER_SITE_OTHER): Payer: Medicare Other

## 2014-04-25 DIAGNOSIS — E7800 Pure hypercholesterolemia, unspecified: Secondary | ICD-10-CM

## 2014-04-25 DIAGNOSIS — C73 Malignant neoplasm of thyroid gland: Secondary | ICD-10-CM

## 2014-04-25 DIAGNOSIS — R739 Hyperglycemia, unspecified: Secondary | ICD-10-CM

## 2014-04-25 LAB — HEPATIC FUNCTION PANEL
ALK PHOS: 49 U/L (ref 39–117)
ALT: 76 U/L — ABNORMAL HIGH (ref 0–35)
AST: 52 U/L — ABNORMAL HIGH (ref 0–37)
Albumin: 3.5 g/dL (ref 3.5–5.2)
BILIRUBIN DIRECT: 0 mg/dL (ref 0.0–0.3)
BILIRUBIN TOTAL: 0.6 mg/dL (ref 0.2–1.2)
Total Protein: 6.5 g/dL (ref 6.0–8.3)

## 2014-04-25 LAB — HEMOGLOBIN A1C: Hgb A1c MFr Bld: 5.9 % (ref 4.6–6.5)

## 2014-04-26 LAB — GLUCOSE, FASTING: Glucose, Fasting: 80 mg/dL (ref 70–99)

## 2014-04-26 LAB — TSH: TSH: 2.29 u[IU]/mL (ref 0.35–4.50)

## 2014-04-27 ENCOUNTER — Encounter: Payer: Self-pay | Admitting: *Deleted

## 2014-05-01 ENCOUNTER — Other Ambulatory Visit: Payer: Self-pay | Admitting: *Deleted

## 2014-05-01 ENCOUNTER — Telehealth: Payer: Self-pay | Admitting: Internal Medicine

## 2014-05-01 ENCOUNTER — Telehealth: Payer: Self-pay | Admitting: *Deleted

## 2014-05-01 DIAGNOSIS — R945 Abnormal results of liver function studies: Secondary | ICD-10-CM

## 2014-05-01 DIAGNOSIS — R7989 Other specified abnormal findings of blood chemistry: Secondary | ICD-10-CM

## 2014-05-01 MED ORDER — LIOTHYRONINE SODIUM 5 MCG PO TABS: ORAL_TABLET | ORAL | Status: DC

## 2014-05-01 NOTE — Telephone Encounter (Signed)
Order placed for labs (f/u liver panel)

## 2014-05-01 NOTE — Telephone Encounter (Signed)
[name] came by this morning saying she's completely out of Cytomel and is wondering if a refill can be sent to her pharmacy at [facility]. If you have any questions, please call the patient. Pt ph# [phone] Thank you.

## 2014-05-01 NOTE — Telephone Encounter (Signed)
Pt coming tomorrow what labs and dx? 

## 2014-05-02 ENCOUNTER — Other Ambulatory Visit (INDEPENDENT_AMBULATORY_CARE_PROVIDER_SITE_OTHER): Payer: Medicare Other

## 2014-05-02 DIAGNOSIS — R945 Abnormal results of liver function studies: Secondary | ICD-10-CM

## 2014-05-02 DIAGNOSIS — R7989 Other specified abnormal findings of blood chemistry: Secondary | ICD-10-CM

## 2014-05-02 LAB — HEPATIC FUNCTION PANEL
ALK PHOS: 48 U/L (ref 39–117)
ALT: 69 U/L — ABNORMAL HIGH (ref 0–35)
AST: 46 U/L — AB (ref 0–37)
Albumin: 3.2 g/dL — ABNORMAL LOW (ref 3.5–5.2)
BILIRUBIN TOTAL: 0.4 mg/dL (ref 0.2–1.2)
Bilirubin, Direct: 0 mg/dL (ref 0.0–0.3)
Total Protein: 6.2 g/dL (ref 6.0–8.3)

## 2014-05-03 ENCOUNTER — Encounter: Payer: Self-pay | Admitting: Internal Medicine

## 2014-05-03 DIAGNOSIS — R7989 Other specified abnormal findings of blood chemistry: Secondary | ICD-10-CM

## 2014-05-03 DIAGNOSIS — R945 Abnormal results of liver function studies: Secondary | ICD-10-CM

## 2014-05-04 NOTE — Telephone Encounter (Signed)
Order placed for abdominal ultrasound.   

## 2014-05-07 ENCOUNTER — Ambulatory Visit: Payer: Self-pay | Admitting: Internal Medicine

## 2014-05-09 ENCOUNTER — Other Ambulatory Visit: Payer: Self-pay | Admitting: Internal Medicine

## 2014-05-10 ENCOUNTER — Encounter: Payer: Self-pay | Admitting: Internal Medicine

## 2014-05-10 ENCOUNTER — Telehealth: Payer: Self-pay | Admitting: Internal Medicine

## 2014-05-10 NOTE — Telephone Encounter (Signed)
Please call pt and notify her that her abdominal ultrasound reveals no acute abnormality.  Liver appears to be wnl.  I would like to recheck liver panel in 7-10 days.  Please schedule a non fasting lab.    Dr Nicki Reaper

## 2014-05-10 NOTE — Telephone Encounter (Signed)
Per previous lab note, we had pt to hold her lipitor.  She sent me a my chart message that the pharmacy had refills for her yesterday.  Please cancel refills at pharmacy.  I informed pt via my chart.

## 2014-05-10 NOTE — Telephone Encounter (Signed)
Called pharmacy and canceled refill.

## 2014-05-10 NOTE — Telephone Encounter (Signed)
Pt notified and  verbalized understanding. Lab appt scheduled 05/22/14

## 2014-05-11 ENCOUNTER — Encounter: Payer: Self-pay | Admitting: Internal Medicine

## 2014-05-14 ENCOUNTER — Encounter: Payer: Self-pay | Admitting: Internal Medicine

## 2014-05-14 ENCOUNTER — Ambulatory Visit (INDEPENDENT_AMBULATORY_CARE_PROVIDER_SITE_OTHER): Payer: Medicare Other | Admitting: Internal Medicine

## 2014-05-14 ENCOUNTER — Telehealth: Payer: Self-pay | Admitting: Internal Medicine

## 2014-05-14 VITALS — BP 140/60 | HR 67 | Temp 97.7°F | Ht 61.5 in | Wt 118.5 lb

## 2014-05-14 DIAGNOSIS — E7800 Pure hypercholesterolemia, unspecified: Secondary | ICD-10-CM

## 2014-05-14 DIAGNOSIS — R221 Localized swelling, mass and lump, neck: Secondary | ICD-10-CM

## 2014-05-14 DIAGNOSIS — F329 Major depressive disorder, single episode, unspecified: Secondary | ICD-10-CM

## 2014-05-14 DIAGNOSIS — R609 Edema, unspecified: Secondary | ICD-10-CM

## 2014-05-14 DIAGNOSIS — R7989 Other specified abnormal findings of blood chemistry: Secondary | ICD-10-CM

## 2014-05-14 DIAGNOSIS — D649 Anemia, unspecified: Secondary | ICD-10-CM

## 2014-05-14 DIAGNOSIS — R799 Abnormal finding of blood chemistry, unspecified: Secondary | ICD-10-CM | POA: Insufficient documentation

## 2014-05-14 DIAGNOSIS — C73 Malignant neoplasm of thyroid gland: Secondary | ICD-10-CM

## 2014-05-14 DIAGNOSIS — F32A Depression, unspecified: Secondary | ICD-10-CM

## 2014-05-14 DIAGNOSIS — R945 Abnormal results of liver function studies: Secondary | ICD-10-CM

## 2014-05-14 LAB — BASIC METABOLIC PANEL
BUN: 16 mg/dL (ref 6–23)
CO2: 30 meq/L (ref 19–32)
Calcium: 9.3 mg/dL (ref 8.4–10.5)
Chloride: 101 mEq/L (ref 96–112)
Creatinine, Ser: 0.7 mg/dL (ref 0.4–1.2)
GFR: 88.13 mL/min (ref >60.00)
GLUCOSE: 87 mg/dL (ref 70–99)
POTASSIUM: 4.4 meq/L (ref 3.5–5.1)
Sodium: 139 mEq/L (ref 135–145)

## 2014-05-14 LAB — HEPATIC FUNCTION PANEL
ALT: 77 U/L — AB (ref 0–35)
AST: 47 U/L — ABNORMAL HIGH (ref 0–37)
Albumin: 3.9 g/dL (ref 3.5–5.2)
Alkaline Phosphatase: 58 U/L (ref 39–117)
BILIRUBIN TOTAL: 0.5 mg/dL (ref 0.2–1.2)
Bilirubin, Direct: 0.1 mg/dL (ref 0.0–0.3)
Total Protein: 6 g/dL (ref 6.0–8.3)

## 2014-05-14 LAB — CBC WITH DIFFERENTIAL/PLATELET
BASOS ABS: 0.1 10*3/uL (ref 0.0–0.1)
Basophils Relative: 0.6 % (ref 0.0–3.0)
Eosinophils Absolute: 0.1 10*3/uL (ref 0.0–0.7)
Eosinophils Relative: 0.9 % (ref 0.0–5.0)
HCT: 34.1 % — ABNORMAL LOW (ref 36.0–46.0)
Hemoglobin: 11.1 g/dL — ABNORMAL LOW (ref 12.0–15.0)
LYMPHS ABS: 2.7 10*3/uL (ref 0.7–4.0)
Lymphocytes Relative: 30.5 % (ref 12.0–46.0)
MCHC: 32.6 g/dL (ref 30.0–36.0)
MCV: 91.6 fl (ref 78.0–100.0)
Monocytes Absolute: 0.9 10*3/uL (ref 0.1–1.0)
Monocytes Relative: 9.8 % (ref 3.0–12.0)
Neutro Abs: 5.1 10*3/uL (ref 1.4–7.7)
Neutrophils Relative %: 58.2 % (ref 43.0–77.0)
PLATELETS: 224 10*3/uL (ref 150.0–400.0)
RBC: 3.72 Mil/uL — ABNORMAL LOW (ref 3.87–5.11)
RDW: 14.7 % (ref 11.5–15.5)
WBC: 8.8 10*3/uL (ref 4.0–10.5)

## 2014-05-14 LAB — TSH: TSH: 0.35 u[IU]/mL (ref 0.35–4.50)

## 2014-05-14 MED ORDER — LIOTHYRONINE SODIUM 5 MCG PO TABS: 7.5000 ug | ORAL_TABLET | Freq: Every day | ORAL | Status: DC

## 2014-05-14 NOTE — Progress Notes (Signed)
Subjective:    Patient ID: Paige Farmer, female    DOB: 1931-05-31, 80 y.o.   MRN: [id number]  HPI 80 year old female with past history of palpitations, glaucoma, depression and thyroid cancer.  She comes in today for a scheduled follow up.  States overall she feels she is doing well.  Sees Dr Saralyn Pilar every 6 months.  Has had a holter.  She uses drops for her glaucoma.  Sees opthalmology regularly.  Some increased stress and some increased depression (minimal).  This is related to her husband's medical condition.  We discussed this in detail today.  Was on citalopram.  Did not feel this helped.  Now on zoloft.  Taking 1 1/2 tablet per day.  Eating and drinking well.   No headache.   No chest pain or tightness.  Breathing stable.  She has been taking her synthroid - wrong dose.  Increased swelling - feet and lower extremities.       Past Medical History  Diagnosis Date  . H/O: depression   . History of chicken pox   . Glaucoma   . Thyroid cancer   . Depression   . Thyroid disease   . Palpitations     Outpatient Encounter Prescriptions as of 05/14/2014  Medication Sig  . aspirin EC 81 MG tablet Take 81 mg by mouth daily.  . Calcium Carb-Cholecalciferol (CALCIUM 600 + D PO) Take by mouth 2 (two) times daily.  Marland Kitchen levothyroxine (SYNTHROID, LEVOTHROID) 75 MCG tablet Take 1 tablet (75 mcg total) by mouth daily.  Marland Kitchen liothyronine (CYTOMEL) 5 MCG tablet Take 1.5 tablets (7.5 mcg total) by mouth daily.  . Multiple Vitamins-Minerals (CENTRUM) tablet Take 1 tablet by mouth daily.  . Multiple Vitamins-Minerals (PRESERVISION/LUTEIN) CAPS Take by mouth 2 (two) times daily.  . sertraline (ZOLOFT) 50 MG tablet TAKE 1 1/2 TABLET PER DAY  . timolol (BETIMOL) 0.5 % ophthalmic solution Place 1 drop into both eyes 2 (two) times daily.  . [DISCONTINUED] liothyronine (CYTOMEL) 5 MCG tablet Take 7.5 mcg by mouth daily.  . [DISCONTINUED] liothyronine (CYTOMEL) 5 MCG tablet TAKE 1 TABLET (5 MCG TOTAL) BY MOUTH  DAILY.    Review of Systems Patient denies any headache, lightheadedness or dizziness.  No vision change.  No chest pain, tightness or palpitations.  No increased shortness of breath, cough or congestion.  No nausea or vomiting. No abdominal pain or cramping.  No bowel change, such as diarrhea, BRBPR or melana.   No urine change.  Some increased stress and minimal increased depression.  On zoloft.  Increased lower extremity swelling as outlined.            Objective:   Physical Exam  Filed Vitals:   05/14/14 1139  BP: 140/60  Pulse: 67  Temp: 97.7 F (36.5 C)   Blood pressure recheck:  33/66  80 year old female in no acute distress.   HEENT:  Nares- clear.  Oropharynx - without lesions. NECK:  Supple.  Nontender.  No audible bruit.  HEART:  Appears to be regular. LUNGS:  No crackles or wheezing audible.  Respirations even and unlabored.  RADIAL PULSE:  Equal bilaterally.   ABDOMEN:  Soft, nontender.  Bowel sounds present and normal.  No audible abdominal bruit.    EXTREMITIES:  Increased pedal and lower extremity edema.  No increased erythema.   DP pulses palpable and equal bilaterally.          Assessment & Plan:  1. Thyroid cancer Refer  back to Dr Ma Hillock.  On cytomel and synthroid.  Has not been taking the correct dose of synthroid.   - TSH  2. Hypercholesterolemia Low cholesterol diet and exercise.  Declines cholesterol medication.  Follow lipid panel.    3. Anemia, unspecified anemia type Hgb 03/07/14 - wnl.   - CBC with Differential  4. Abnormal liver function tests Elevated liver function tests.  Abdominal ultrasound - no acute abnormality.  No fatty liver.  Recheck liver panel today.   - Hepatic function panel  5. Edema Pedal and lower extremity swelling.  Unclear etiology.  Recheck kidney function, liver function, hgb and thyroid.  Compression hose.  Leg elevation.  Avoid increased salt intake.   6. Depression Increased stress with her husband's medical  issues.  On zoloft.  Follow.    7. Lump in neck Just evaluated by Dr Pryor Ochoa.  Had CT.  Decided to follow.  Recheck laryngoscopy in 3 months.    HEALTH MAINTENANCE.  Physical 12/29/13.  Had a colonoscopy just over one year ago.  Mammogram 04/12/14 - Birads I.     I spent 25 minutes with the patient and more than 50% of the time was spent in consultation regarding the above.

## 2014-05-14 NOTE — Telephone Encounter (Signed)
Pt stopped by and had a question regarding her Rx Liothyronine 5MCG. In the printed out directions for her it says to take 7.20mcg daily. The bottle says her pills are 15mcg so she's been taking 1 1/2 every day. She wants to make sure she's been taking the medication correctly. She needs a refill and she wants to make sure the pharmacy has the correct instructions for her as well. Please call the pt to let her know if she should continue taking 1 1/2 pills every day. Pt ph# [phone] Thank you.

## 2014-05-14 NOTE — Telephone Encounter (Signed)
Spoke with pt, advised as per chart she is to be taking 7.5 mcg.  Refill sent to pharmacy

## 2014-05-14 NOTE — Progress Notes (Signed)
Pre visit review using our clinic review tool, if applicable. No additional management support is needed unless otherwise documented below in the visit note. 

## 2014-05-15 ENCOUNTER — Encounter: Payer: Self-pay | Admitting: *Deleted

## 2014-05-15 ENCOUNTER — Other Ambulatory Visit: Payer: Self-pay | Admitting: Internal Medicine

## 2014-05-15 ENCOUNTER — Other Ambulatory Visit (INDEPENDENT_AMBULATORY_CARE_PROVIDER_SITE_OTHER): Payer: Medicare Other

## 2014-05-15 DIAGNOSIS — Z79899 Other long term (current) drug therapy: Secondary | ICD-10-CM

## 2014-05-15 DIAGNOSIS — D649 Anemia, unspecified: Secondary | ICD-10-CM

## 2014-05-15 LAB — IBC PANEL
Iron: 61 ug/dL (ref 42–145)
SATURATION RATIOS: 20.8 % (ref 20.0–50.0)
TRANSFERRIN: 209.7 mg/dL — AB (ref 212.0–360.0)

## 2014-05-15 LAB — VITAMIN B12: VITAMIN B 12: 1052 pg/mL — AB (ref 211–911)

## 2014-05-15 LAB — FERRITIN: Ferritin: 39.3 ng/mL (ref 10.0–291.0)

## 2014-05-15 NOTE — Progress Notes (Signed)
Orders placed for f/u labs.  

## 2014-05-15 NOTE — Telephone Encounter (Signed)
Pt notified of results over the phone.

## 2014-05-16 ENCOUNTER — Other Ambulatory Visit: Payer: Self-pay | Admitting: Internal Medicine

## 2014-05-16 DIAGNOSIS — R945 Abnormal results of liver function studies: Secondary | ICD-10-CM

## 2014-05-16 DIAGNOSIS — R7989 Other specified abnormal findings of blood chemistry: Secondary | ICD-10-CM

## 2014-05-16 NOTE — Progress Notes (Signed)
Order placed for GI referral.   

## 2014-05-19 ENCOUNTER — Telehealth: Payer: Self-pay | Admitting: Internal Medicine

## 2014-05-19 ENCOUNTER — Encounter: Payer: Self-pay | Admitting: Internal Medicine

## 2014-05-19 DIAGNOSIS — R220 Localized swelling, mass and lump, head: Secondary | ICD-10-CM | POA: Insufficient documentation

## 2014-05-19 DIAGNOSIS — R221 Localized swelling, mass and lump, neck: Secondary | ICD-10-CM | POA: Insufficient documentation

## 2014-05-19 NOTE — Telephone Encounter (Signed)
Pt was referred to Dr Ma Hillock in 02/2014.  Need notes if she kept appt.  Thanks.  Have them let us know if no appt.

## 2014-05-21 ENCOUNTER — Telehealth: Payer: Self-pay | Admitting: *Deleted

## 2014-05-21 DIAGNOSIS — R7989 Other specified abnormal findings of blood chemistry: Secondary | ICD-10-CM

## 2014-05-21 DIAGNOSIS — R945 Abnormal results of liver function studies: Secondary | ICD-10-CM

## 2014-05-21 DIAGNOSIS — D649 Anemia, unspecified: Secondary | ICD-10-CM

## 2014-05-21 NOTE — Telephone Encounter (Signed)
What labs and dx?  

## 2014-05-21 NOTE — Telephone Encounter (Signed)
Orders placed for labs

## 2014-05-21 NOTE — Telephone Encounter (Signed)
Records requested via fax 

## 2014-05-22 ENCOUNTER — Encounter: Payer: Self-pay | Admitting: Internal Medicine

## 2014-05-22 ENCOUNTER — Other Ambulatory Visit (INDEPENDENT_AMBULATORY_CARE_PROVIDER_SITE_OTHER): Payer: Medicare Other

## 2014-05-22 DIAGNOSIS — D649 Anemia, unspecified: Secondary | ICD-10-CM

## 2014-05-22 DIAGNOSIS — R7989 Other specified abnormal findings of blood chemistry: Secondary | ICD-10-CM

## 2014-05-22 DIAGNOSIS — R945 Abnormal results of liver function studies: Secondary | ICD-10-CM

## 2014-05-22 LAB — CBC WITH DIFFERENTIAL/PLATELET
BASOS PCT: 0.6 % (ref 0.0–3.0)
Basophils Absolute: 0 10*3/uL (ref 0.0–0.1)
Eosinophils Absolute: 0.1 10*3/uL (ref 0.0–0.7)
Eosinophils Relative: 1.5 % (ref 0.0–5.0)
HCT: 34.5 % — ABNORMAL LOW (ref 36.0–46.0)
Hemoglobin: 11.4 g/dL — ABNORMAL LOW (ref 12.0–15.0)
Lymphocytes Relative: 33.1 % (ref 12.0–46.0)
Lymphs Abs: 2.5 10*3/uL (ref 0.7–4.0)
MCHC: 33 g/dL (ref 30.0–36.0)
MCV: 91.6 fl (ref 78.0–100.0)
MONO ABS: 0.7 10*3/uL (ref 0.1–1.0)
MONOS PCT: 8.7 % (ref 3.0–12.0)
NEUTROS PCT: 56.1 % (ref 43.0–77.0)
Neutro Abs: 4.2 10*3/uL (ref 1.4–7.7)
Platelets: 226 10*3/uL (ref 150.0–400.0)
RBC: 3.77 Mil/uL — AB (ref 3.87–5.11)
RDW: 14.9 % (ref 11.5–15.5)
WBC: 7.5 10*3/uL (ref 4.0–10.5)

## 2014-05-22 LAB — HEPATIC FUNCTION PANEL
ALK PHOS: 58 U/L (ref 39–117)
ALT: 77 U/L — ABNORMAL HIGH (ref 0–35)
AST: 51 U/L — AB (ref 0–37)
Albumin: 3.6 g/dL (ref 3.5–5.2)
BILIRUBIN DIRECT: 0.1 mg/dL (ref 0.0–0.3)
Total Bilirubin: 0.7 mg/dL (ref 0.2–1.2)
Total Protein: 5.7 g/dL — ABNORMAL LOW (ref 6.0–8.3)

## 2014-05-22 NOTE — Telephone Encounter (Signed)
Notes received & placed in green folder

## 2014-05-22 NOTE — Addendum Note (Signed)
Addended by: Johnsie Cancel on: 05/22/2014 08:15 AM   Modules accepted: Orders

## 2014-05-23 ENCOUNTER — Other Ambulatory Visit: Payer: Medicare Other

## 2014-05-24 ENCOUNTER — Other Ambulatory Visit: Payer: Self-pay | Admitting: Internal Medicine

## 2014-05-24 NOTE — Telephone Encounter (Signed)
Unread mychart message mailed to patient 

## 2014-06-28 ENCOUNTER — Encounter: Payer: Self-pay | Admitting: Internal Medicine

## 2014-06-28 ENCOUNTER — Ambulatory Visit (INDEPENDENT_AMBULATORY_CARE_PROVIDER_SITE_OTHER): Payer: Medicare Other | Admitting: Internal Medicine

## 2014-06-28 VITALS — BP 130/70 | HR 61 | Temp 98.0°F | Ht 61.5 in | Wt 114.2 lb

## 2014-06-28 DIAGNOSIS — D649 Anemia, unspecified: Secondary | ICD-10-CM

## 2014-06-28 DIAGNOSIS — E7800 Pure hypercholesterolemia, unspecified: Secondary | ICD-10-CM

## 2014-06-28 DIAGNOSIS — R7989 Other specified abnormal findings of blood chemistry: Secondary | ICD-10-CM

## 2014-06-28 DIAGNOSIS — R634 Abnormal weight loss: Secondary | ICD-10-CM

## 2014-06-28 DIAGNOSIS — F32A Depression, unspecified: Secondary | ICD-10-CM

## 2014-06-28 DIAGNOSIS — R945 Abnormal results of liver function studies: Secondary | ICD-10-CM

## 2014-06-28 DIAGNOSIS — F329 Major depressive disorder, single episode, unspecified: Secondary | ICD-10-CM

## 2014-06-28 DIAGNOSIS — R221 Localized swelling, mass and lump, neck: Secondary | ICD-10-CM

## 2014-06-28 DIAGNOSIS — C73 Malignant neoplasm of thyroid gland: Secondary | ICD-10-CM

## 2014-06-28 LAB — CBC WITH DIFFERENTIAL/PLATELET
BASOS ABS: 0.1 10*3/uL (ref 0.0–0.1)
Basophils Relative: 0.6 % (ref 0.0–3.0)
Eosinophils Absolute: 0.1 10*3/uL (ref 0.0–0.7)
Eosinophils Relative: 1.5 % (ref 0.0–5.0)
HEMATOCRIT: 36.7 % (ref 36.0–46.0)
Hemoglobin: 12 g/dL (ref 12.0–15.0)
Lymphocytes Relative: 31.4 % (ref 12.0–46.0)
Lymphs Abs: 2.8 10*3/uL (ref 0.7–4.0)
MCHC: 32.6 g/dL (ref 30.0–36.0)
MCV: 90.7 fl (ref 78.0–100.0)
MONO ABS: 0.8 10*3/uL (ref 0.1–1.0)
Monocytes Relative: 9.2 % (ref 3.0–12.0)
Neutro Abs: 5 10*3/uL (ref 1.4–7.7)
Neutrophils Relative %: 57.3 % (ref 43.0–77.0)
PLATELETS: 239 10*3/uL (ref 150.0–400.0)
RBC: 4.05 Mil/uL (ref 3.87–5.11)
RDW: 15.4 % (ref 11.5–15.5)
WBC: 8.8 10*3/uL (ref 4.0–10.5)

## 2014-06-28 LAB — HEPATIC FUNCTION PANEL
ALBUMIN: 3.9 g/dL (ref 3.5–5.2)
ALK PHOS: 63 U/L (ref 39–117)
ALT: 34 U/L (ref 0–35)
AST: 28 U/L (ref 0–37)
Bilirubin, Direct: 0 mg/dL (ref 0.0–0.3)
Total Bilirubin: 0.6 mg/dL (ref 0.2–1.2)
Total Protein: 6.9 g/dL (ref 6.0–8.3)

## 2014-06-28 NOTE — Progress Notes (Signed)
Pre visit review using our clinic review tool, if applicable. No additional management support is needed unless otherwise documented below in the visit note. 

## 2014-06-29 ENCOUNTER — Encounter: Payer: Self-pay | Admitting: Internal Medicine

## 2014-07-01 ENCOUNTER — Encounter: Payer: Self-pay | Admitting: Internal Medicine

## 2014-07-01 NOTE — Progress Notes (Signed)
Subjective:    Patient ID: Paige Farmer, female    DOB: 08/18/1930, 81 y.o.   MRN: [id number]  HPI 81 year old female with past history of palpitations, glaucoma, depression and thyroid cancer.  She comes in today for a scheduled follow up.  States overall she feels she is doing well.  Sees Dr Saralyn Pilar every 6 months.  Has had a holter.  She uses drops for her glaucoma.  Sees opthalmology regularly.  Some increased stress and some increased depression (minimal).  This is related to her husband's medical condition.  We discussed this again today.  She does feel she has more energy.   On zoloft.  Taking 1 1/2 tablet per day.  Eating and drinking well.   No headache.   No chest pain or tightness.  Breathing stable.  Seeing Dr Ma Hillock for her thyroid cancer.  Recently found to have elevated liver function tests.  Unclear etiology.   Had abdominal ultrasound 04/2014 - no acute intraabdominal pathology.  Had noticed some diarrhea after Christmas.  She was eating a lot of fruit.  She has cut back on the amount of fruit and bowels are better now.  Had a normal bowel movement yesterday.  Weight is down.     Past Medical History  Diagnosis Date  . H/O: depression   . History of chicken pox   . Glaucoma   . Thyroid cancer   . Depression   . Thyroid disease   . Palpitations     Outpatient Encounter Prescriptions as of 06/28/2014  Medication Sig  . aspirin EC 81 MG tablet Take 81 mg by mouth daily.  Marland Kitchen levothyroxine (SYNTHROID, LEVOTHROID) 75 MCG tablet TAKE 1 TABLET (75 MCG TOTAL) BY MOUTH DAILY.  Marland Kitchen liothyronine (CYTOMEL) 5 MCG tablet Take 1.5 tablets (7.5 mcg total) by mouth daily.  . Multiple Vitamins-Minerals (CENTRUM) tablet Take 1 tablet by mouth daily.  . Multiple Vitamins-Minerals (PRESERVISION/LUTEIN) CAPS Take by mouth 2 (two) times daily.  . sertraline (ZOLOFT) 50 MG tablet TAKE 1 1/2 TABLET PER DAY  . timolol (BETIMOL) 0.5 % ophthalmic solution Place 1 drop into both eyes 2 (two) times  daily.  . [DISCONTINUED] Calcium Carb-Cholecalciferol (CALCIUM 600 + D PO) Take by mouth 2 (two) times daily.    Review of Systems Patient denies any headache, lightheadedness or dizziness.  No vision change.  No chest pain, tightness or palpitations.  No increased shortness of breath, cough or congestion.  No nausea or vomiting. No abdominal pain or cramping.  No bowel change, such as diarrhea, BRBPR or melana now.  Was previously having diarrhea.  This has resolved.   No urine change.  Some increased stress and minimal increased depression.  On zoloft.  Energy better now.  Has low weight.  Recent elevated liver enzymes.        Objective:   Physical Exam  Filed Vitals:   06/28/14 1128  BP: 130/70  Pulse: 61  Temp: 98 F (7.41 C)   81 year old female in no acute distress.   HEENT:  Nares- clear.  Oropharynx - without lesions. NECK:  Supple.  Nontender.  No audible bruit.  HEART:  Appears to be regular. LUNGS:  No crackles or wheezing audible.  Respirations even and unlabored.  RADIAL PULSE:  Equal bilaterally.   ABDOMEN:  Soft, nontender.  Bowel sounds present and normal.  No audible abdominal bruit.    EXTREMITIES:  No increased edema.  Assessment & Plan:  1. Abnormal liver function tests Recently found to have elevated liver enzymes.  Unclear etiology.  Abdominal ultrasound did not reveal any acute abnormality.  Diarrhea recently.  This has improved.  Normal bowel movement yesterday.  No abdominal pain.  Weight loss.  Recheck  - Hepatic function panel Refer to GI for further evaluaiton.    2. Anemia, unspecified anemia type 05/22/14 hgb - 11.4.  B12 ok.  Recheck cbc today.  - CBC with Differential  3. Thyroid cancer Evaluated recently by Dr Ma Hillock.  See his note for details.  Follow.    4. Depression On zoloft.  Appears to be stable.    5. Hypercholesterolemia Low cholesterol diet and exercise.  Hold on cholesterol medication until can get liver issue  evaluated.    6. Lump in neck Just evaluated by Dr Pryor Ochoa.  Had CT.  Decided to follow.  Recheck laryngoscopy in 3 months.    7.  WEIGHT LOSS.  Weight is down several pounds from the last check.  Encourage increased po intake.  Bowels back to normal.  Elevated liver enzymes.  Unclear etiology.  Recheck today.  Abdominal ultrasound revealed no acute abnormality.     HEALTH MAINTENANCE.  Physical 12/29/13.  Had a colonoscopy just over one year ago.  Mammogram 04/12/14 - Birads I.     I spent 25 minutes with the patient and more than 50% of the time was spent in consultation regarding the above.

## 2014-07-02 NOTE — Telephone Encounter (Signed)
Unread mychart message mailed to patient 

## 2014-07-13 ENCOUNTER — Other Ambulatory Visit: Payer: Self-pay | Admitting: Internal Medicine

## 2014-08-09 DIAGNOSIS — R109 Unspecified abdominal pain: Secondary | ICD-10-CM | POA: Insufficient documentation

## 2014-08-09 DIAGNOSIS — R748 Abnormal levels of other serum enzymes: Secondary | ICD-10-CM | POA: Insufficient documentation

## 2014-08-09 DIAGNOSIS — R197 Diarrhea, unspecified: Secondary | ICD-10-CM | POA: Insufficient documentation

## 2014-08-13 ENCOUNTER — Other Ambulatory Visit: Payer: Self-pay | Admitting: Unknown Physician Specialty

## 2014-08-23 ENCOUNTER — Other Ambulatory Visit: Payer: Self-pay | Admitting: Internal Medicine

## 2014-09-11 DIAGNOSIS — I493 Ventricular premature depolarization: Secondary | ICD-10-CM | POA: Insufficient documentation

## 2014-09-12 ENCOUNTER — Other Ambulatory Visit: Payer: Self-pay | Admitting: Internal Medicine

## 2014-10-20 ENCOUNTER — Other Ambulatory Visit: Payer: Self-pay | Admitting: Internal Medicine

## 2015-01-11 ENCOUNTER — Ambulatory Visit (INDEPENDENT_AMBULATORY_CARE_PROVIDER_SITE_OTHER): Payer: Medicare Other | Admitting: Internal Medicine

## 2015-01-11 ENCOUNTER — Encounter: Payer: Self-pay | Admitting: Internal Medicine

## 2015-01-11 VITALS — BP 110/70 | HR 64 | Temp 98.5°F | Ht 61.0 in | Wt 112.1 lb

## 2015-01-11 DIAGNOSIS — E7800 Pure hypercholesterolemia, unspecified: Secondary | ICD-10-CM

## 2015-01-11 DIAGNOSIS — R7989 Other specified abnormal findings of blood chemistry: Secondary | ICD-10-CM

## 2015-01-11 DIAGNOSIS — R739 Hyperglycemia, unspecified: Secondary | ICD-10-CM

## 2015-01-11 DIAGNOSIS — R221 Localized swelling, mass and lump, neck: Secondary | ICD-10-CM

## 2015-01-11 DIAGNOSIS — F329 Major depressive disorder, single episode, unspecified: Secondary | ICD-10-CM

## 2015-01-11 DIAGNOSIS — C73 Malignant neoplasm of thyroid gland: Secondary | ICD-10-CM

## 2015-01-11 DIAGNOSIS — Z0000 Encounter for general adult medical examination without abnormal findings: Secondary | ICD-10-CM

## 2015-01-11 DIAGNOSIS — D649 Anemia, unspecified: Secondary | ICD-10-CM

## 2015-01-11 DIAGNOSIS — J351 Hypertrophy of tonsils: Secondary | ICD-10-CM

## 2015-01-11 DIAGNOSIS — Z1239 Encounter for other screening for malignant neoplasm of breast: Secondary | ICD-10-CM

## 2015-01-11 DIAGNOSIS — F32A Depression, unspecified: Secondary | ICD-10-CM

## 2015-01-11 DIAGNOSIS — R945 Abnormal results of liver function studies: Secondary | ICD-10-CM

## 2015-01-11 NOTE — Progress Notes (Signed)
Pre visit review using our clinic review tool, if applicable. No additional management support is needed unless otherwise documented below in the visit note. 

## 2015-01-11 NOTE — Progress Notes (Signed)
Patient ID: Paige Farmer, female   DOB: December 02, 1930, 81 y.o.   MRN: [id number]   Subjective:    Patient ID: Paige Farmer, female    DOB: [birth year], 81 y.o.   MRN: [id number]  HPI  Patient here to follow up on these medical issues as well as for a complete physical exam.  She is dealing with the stress of her husband's death and her daughter-n-law's death.  Has support from her son.  Does not feel she needs anything more at this point.  She is eating .  States appetitie has improved now.  Food is starting to taste good again.  Weight is down some, but she feels is now leveling off - since eating better.  No nausea or vomiting.  No bowel change.     Past Medical History  Diagnosis Date  . H/O: depression   . History of chicken pox   . Glaucoma   . Thyroid cancer   . Depression   . Thyroid disease   . Palpitations     Outpatient Encounter Prescriptions as of 01/11/2015  Medication Sig  . aspirin EC 81 MG tablet Take 81 mg by mouth daily.  Marland Kitchen levothyroxine (SYNTHROID, LEVOTHROID) 75 MCG tablet TAKE 1 TABLET (75 MCG TOTAL) BY MOUTH DAILY.  Marland Kitchen liothyronine (CYTOMEL) 5 MCG tablet Take 1.5 tablets (7.5 mcg total) by mouth daily.  . Multiple Vitamins-Minerals (CENTRUM) tablet Take 1 tablet by mouth daily.  . Multiple Vitamins-Minerals (PRESERVISION/LUTEIN) CAPS Take by mouth 2 (two) times daily.  . sertraline (ZOLOFT) 50 MG tablet TAKE 1 AND  1/2 TABLET PER DAY  . timolol (BETIMOL) 0.5 % ophthalmic solution Place 1 drop into both eyes 2 (two) times daily.  . [DISCONTINUED] levothyroxine (SYNTHROID, LEVOTHROID) 75 MCG tablet TAKE 1 TABLET (75 MCG TOTAL) BY MOUTH DAILY.   No facility-administered encounter medications on file as of 01/11/2015.    Review of Systems  Constitutional:       Weight is down.  Appetite improving now.    HENT: Negative for congestion and sinus pressure.   Respiratory: Negative for cough, chest tightness and shortness of breath.   Cardiovascular: Negative for chest  pain, palpitations and leg swelling.  Gastrointestinal: Negative for nausea, vomiting, abdominal pain and diarrhea.  Genitourinary: Negative for dysuria and difficulty urinating.  Skin: Negative for color change and rash.  Neurological: Negative for dizziness, light-headedness and headaches.  Psychiatric/Behavioral: Negative for dysphoric mood and agitation.       Objective:    Physical Exam  Constitutional: She appears well-developed and well-nourished. No distress.  HENT:  Nose: Nose normal.  Mouth/Throat: Oropharynx is clear and moist.  Neck: Neck supple. No thyromegaly present.  Cardiovascular: Normal rate and regular rhythm.   Pulmonary/Chest: Breath sounds normal. No respiratory distress. She has no wheezes.  Abdominal: Soft. Bowel sounds are normal. There is no tenderness.  Musculoskeletal: She exhibits no edema or tenderness.  Lymphadenopathy:    She has no cervical adenopathy.  Skin: No rash noted. No erythema.  Psychiatric: She has a normal mood and affect. Her behavior is normal.    BP 110/70 mmHg  Pulse 64  Temp(Src) 98.5 F (36.9 C) (Oral)  Ht 5\' 1"  (1.549 m)  Wt 112 lb 2 oz (50.86 kg)  BMI 21.20 kg/m2  SpO2 97% Wt Readings from Last 3 Encounters:  01/11/15 112 lb 2 oz (50.86 kg)  06/28/14 114 lb 4 oz (51.823 kg)  05/14/14 118 lb 8 oz (53.751 kg)  Lab Results  Component Value Date   WBC 8.8 06/28/2014   HGB 12.0 06/28/2014   HCT 36.7 06/28/2014   PLT 239.0 06/28/2014   GLUCOSE 87 05/14/2014   CHOL 300* 03/07/2014   TRIG 84.0 03/07/2014   HDL 78.30 03/07/2014   LDLCALC 205* 03/07/2014   ALT 34 06/28/2014   AST 28 06/28/2014   NA 139 05/14/2014   K 4.4 05/14/2014   CL 101 05/14/2014   CREATININE 0.7 05/14/2014   BUN 16 05/14/2014   CO2 30 05/14/2014   TSH 0.35 05/14/2014   HGBA1C 5.9 04/25/2014       Assessment & Plan:   Problem List Items Addressed This Visit    Abnormal liver function tests    Last liver panel wnl.  Recheck with  next labs.       Relevant Orders   Hepatic function panel   Anemia    Recheck cbc with next labs.       Relevant Orders   CBC with Differential/Platelet   Depression    On zoloft.  Feels she is doing well on this dose.  Follow.  Does not feel she needs anything more at this point.       Health care maintenance    Physical today 01/11/15.  Colonoscopy - a couple of years ago.  Mammogram 04/12/14 - Birads I.        Hypercholesterolemia    Follow lipid panel.        Relevant Orders   Lipid panel   Lump in neck    Saw ENT.  They felt neck exam ok.  Found to have lingual hypertrophy.  Recommended f/u laryngoscopy in three months.  With the recent deaths, she did not keep her f/u with ENT.  Will get this rescheduled.        Relevant Orders   Ambulatory referral to ENT   Thyroid cancer    On cytomel and synthroid.  Recheck tsh with next labs.  Has been followed by Dr Ma Hillock.        Relevant Orders   TSH    Other Visit Diagnoses    Lingual tonsil hypertrophy    -  Primary    Relevant Orders    Ambulatory referral to ENT    Breast cancer screening        Relevant Orders    MM DIGITAL SCREENING BILATERAL    Hyperglycemia        Relevant Orders    Hemoglobin R9F    Basic metabolic panel        Einar Pheasant, MD

## 2015-01-13 ENCOUNTER — Encounter: Payer: Self-pay | Admitting: Internal Medicine

## 2015-01-13 DIAGNOSIS — Z0000 Encounter for general adult medical examination without abnormal findings: Secondary | ICD-10-CM | POA: Insufficient documentation

## 2015-01-13 NOTE — Assessment & Plan Note (Signed)
Recheck cbc with next labs.   

## 2015-01-13 NOTE — Assessment & Plan Note (Addendum)
Saw ENT.  They felt neck exam ok.  Found to have lingual hypertrophy.  Recommended f/u laryngoscopy in three months.  With the recent deaths, she did not keep her f/u with ENT.  Will get this rescheduled.

## 2015-01-13 NOTE — Assessment & Plan Note (Signed)
On cytomel and synthroid.  Recheck tsh with next labs.  Has been followed by Dr Ma Hillock.

## 2015-01-13 NOTE — Assessment & Plan Note (Signed)
On zoloft.  Feels she is doing well on this dose.  Follow.  Does not feel she needs anything more at this point.

## 2015-01-13 NOTE — Assessment & Plan Note (Signed)
Last liver panel wnl.  Recheck with next labs.

## 2015-01-13 NOTE — Assessment & Plan Note (Signed)
Physical today 01/11/15.  Colonoscopy - a couple of years ago.  Mammogram 04/12/14 - Birads I.

## 2015-01-13 NOTE — Assessment & Plan Note (Signed)
Follow lipid panel.   

## 2015-01-15 ENCOUNTER — Other Ambulatory Visit: Payer: Self-pay | Admitting: Internal Medicine

## 2015-01-15 NOTE — Telephone Encounter (Signed)
Last OV 7.22.16.  Please advise refill

## 2015-01-16 NOTE — Telephone Encounter (Signed)
Refilled zoloft #45 with 2 refills.

## 2015-02-07 ENCOUNTER — Other Ambulatory Visit (INDEPENDENT_AMBULATORY_CARE_PROVIDER_SITE_OTHER): Payer: Medicare Other

## 2015-02-07 DIAGNOSIS — D649 Anemia, unspecified: Secondary | ICD-10-CM | POA: Diagnosis not present

## 2015-02-07 DIAGNOSIS — C73 Malignant neoplasm of thyroid gland: Secondary | ICD-10-CM | POA: Diagnosis not present

## 2015-02-07 DIAGNOSIS — E7800 Pure hypercholesterolemia, unspecified: Secondary | ICD-10-CM

## 2015-02-07 DIAGNOSIS — R7989 Other specified abnormal findings of blood chemistry: Secondary | ICD-10-CM | POA: Diagnosis not present

## 2015-02-07 DIAGNOSIS — R945 Abnormal results of liver function studies: Secondary | ICD-10-CM

## 2015-02-07 DIAGNOSIS — R739 Hyperglycemia, unspecified: Secondary | ICD-10-CM | POA: Diagnosis not present

## 2015-02-07 LAB — CBC WITH DIFFERENTIAL/PLATELET
Basophils Absolute: 0 10*3/uL (ref 0.0–0.1)
Basophils Relative: 0.5 % (ref 0.0–3.0)
EOS ABS: 0.2 10*3/uL (ref 0.0–0.7)
EOS PCT: 2.2 % (ref 0.0–5.0)
HCT: 36.9 % (ref 36.0–46.0)
HEMOGLOBIN: 12.2 g/dL (ref 12.0–15.0)
LYMPHS ABS: 3.2 10*3/uL (ref 0.7–4.0)
Lymphocytes Relative: 38.1 % (ref 12.0–46.0)
MCHC: 33 g/dL (ref 30.0–36.0)
MCV: 91.5 fl (ref 78.0–100.0)
MONO ABS: 0.7 10*3/uL (ref 0.1–1.0)
Monocytes Relative: 8.9 % (ref 3.0–12.0)
Neutro Abs: 4.2 10*3/uL (ref 1.4–7.7)
Neutrophils Relative %: 50.3 % (ref 43.0–77.0)
Platelets: 241 10*3/uL (ref 150.0–400.0)
RBC: 4.03 Mil/uL (ref 3.87–5.11)
RDW: 15.3 % (ref 11.5–15.5)
WBC: 8.3 10*3/uL (ref 4.0–10.5)

## 2015-02-07 LAB — LIPID PANEL
CHOLESTEROL: 229 mg/dL — AB (ref 0–200)
HDL: 66.3 mg/dL (ref >39.00)
LDL Cholesterol: 149 mg/dL — ABNORMAL HIGH (ref 0–99)
NonHDL: 162.35
Total CHOL/HDL Ratio: 3
Triglycerides: 68 mg/dL (ref 0.0–149.0)
VLDL: 13.6 mg/dL (ref 0.0–40.0)

## 2015-02-07 LAB — BASIC METABOLIC PANEL
BUN: 21 mg/dL (ref 6–23)
CALCIUM: 9.5 mg/dL (ref 8.4–10.5)
CHLORIDE: 104 meq/L (ref 96–112)
CO2: 32 meq/L (ref 19–32)
Creatinine, Ser: 0.83 mg/dL (ref 0.40–1.20)
GFR: 69.89 mL/min (ref >60.00)
Glucose, Bld: 83 mg/dL (ref 70–99)
Potassium: 4.1 mEq/L (ref 3.5–5.1)
Sodium: 139 mEq/L (ref 135–145)

## 2015-02-07 LAB — HEPATIC FUNCTION PANEL
ALT: 17 U/L (ref 0–35)
AST: 19 U/L (ref 0–37)
Albumin: 3.9 g/dL (ref 3.5–5.2)
Alkaline Phosphatase: 63 U/L (ref 39–117)
BILIRUBIN TOTAL: 0.5 mg/dL (ref 0.2–1.2)
Bilirubin, Direct: 0.1 mg/dL (ref 0.0–0.3)
Total Protein: 7 g/dL (ref 6.0–8.3)

## 2015-02-07 LAB — HEMOGLOBIN A1C: Hgb A1c MFr Bld: 5.8 % (ref 4.6–6.5)

## 2015-02-07 LAB — TSH: TSH: 2.23 u[IU]/mL (ref 0.35–4.50)

## 2015-02-08 ENCOUNTER — Encounter: Payer: Self-pay | Admitting: Internal Medicine

## 2015-02-08 NOTE — Telephone Encounter (Signed)
Pt called to get lab results. Please advise pt/msn

## 2015-02-13 NOTE — Telephone Encounter (Signed)
Unread mychart message mailed to patient 

## 2015-02-17 ENCOUNTER — Other Ambulatory Visit: Payer: Self-pay | Admitting: Internal Medicine

## 2015-04-06 ENCOUNTER — Other Ambulatory Visit: Payer: Self-pay | Admitting: Internal Medicine

## 2015-04-15 ENCOUNTER — Other Ambulatory Visit: Payer: Self-pay | Admitting: Internal Medicine

## 2015-04-15 ENCOUNTER — Ambulatory Visit
Admission: RE | Admit: 2015-04-15 | Discharge: 2015-04-15 | Disposition: A | Discharge status: Home / Self Care | Payer: Medicare Other | Source: Ambulatory Visit | Attending: Internal Medicine | Admitting: Internal Medicine

## 2015-04-15 DIAGNOSIS — Z1239 Encounter for other screening for malignant neoplasm of breast: Secondary | ICD-10-CM

## 2015-04-15 DIAGNOSIS — Z1231 Encounter for screening mammogram for malignant neoplasm of breast: Secondary | ICD-10-CM | POA: Diagnosis present

## 2015-04-16 ENCOUNTER — Encounter: Payer: Self-pay | Admitting: Internal Medicine

## 2015-04-16 ENCOUNTER — Ambulatory Visit (INDEPENDENT_AMBULATORY_CARE_PROVIDER_SITE_OTHER): Payer: Medicare Other | Admitting: Internal Medicine

## 2015-04-16 VITALS — BP 120/62 | HR 53 | Temp 97.6°F | Resp 18 | Ht 61.0 in | Wt 111.0 lb

## 2015-04-16 DIAGNOSIS — R7989 Other specified abnormal findings of blood chemistry: Secondary | ICD-10-CM

## 2015-04-16 DIAGNOSIS — C73 Malignant neoplasm of thyroid gland: Secondary | ICD-10-CM | POA: Diagnosis not present

## 2015-04-16 DIAGNOSIS — K5900 Constipation, unspecified: Secondary | ICD-10-CM

## 2015-04-16 DIAGNOSIS — R945 Abnormal results of liver function studies: Secondary | ICD-10-CM

## 2015-04-16 DIAGNOSIS — F329 Major depressive disorder, single episode, unspecified: Secondary | ICD-10-CM

## 2015-04-16 DIAGNOSIS — F32A Depression, unspecified: Secondary | ICD-10-CM

## 2015-04-16 DIAGNOSIS — D649 Anemia, unspecified: Secondary | ICD-10-CM

## 2015-04-16 DIAGNOSIS — R221 Localized swelling, mass and lump, neck: Secondary | ICD-10-CM

## 2015-04-16 DIAGNOSIS — R739 Hyperglycemia, unspecified: Secondary | ICD-10-CM

## 2015-04-16 DIAGNOSIS — R634 Abnormal weight loss: Secondary | ICD-10-CM

## 2015-04-16 DIAGNOSIS — E7800 Pure hypercholesterolemia, unspecified: Secondary | ICD-10-CM

## 2015-04-16 NOTE — Assessment & Plan Note (Signed)
She is eating better.  Appetite better.  Food tasting better.  Weight relatively stable from last check.  Follow.

## 2015-04-16 NOTE — Assessment & Plan Note (Signed)
Recheck liver panel with labs today.

## 2015-04-16 NOTE — Assessment & Plan Note (Signed)
Bowels are doing better.

## 2015-04-16 NOTE — Assessment & Plan Note (Signed)
On zoloft.  Doing better.  Has good support.  Does not feel she needs any further intervention.  Follow.

## 2015-04-16 NOTE — Progress Notes (Signed)
Pre-visit discussion using our clinic review tool. No additional management support is needed unless otherwise documented below in the visit note.  

## 2015-04-16 NOTE — Assessment & Plan Note (Addendum)
Low cholesterol diet and exercise.   Recheck lipid panel with next labs.

## 2015-04-16 NOTE — Assessment & Plan Note (Signed)
hgb 02/07/15 - wnl (12.3).

## 2015-04-16 NOTE — Assessment & Plan Note (Signed)
On cytomel and synthroid.  Follow tsh.  Has been followed by Dr Ma Hillock.

## 2015-04-16 NOTE — Progress Notes (Signed)
Patient ID: Paige Farmer, female   DOB: 09/22/1930, 81 y.o.   MRN: [id number]   Subjective:    Patient ID: Paige Farmer, female    DOB: 04-27-1931, 81 y.o.   MRN: [id number]  HPI  Patient with past history of depression, thyroid disease and glaucoma.  She comes in today to follow up on these issues.  She feels she is doing better.  Eating better.  Appetite better.  Food taste better.  Weight is relatively stable.  No chest pain or tightness.  No sob.  No acid reflux.  No swallowing problems.  She states that the right side of her neck feels more tight then the left.  With the recent deaths in her family, she has not followed up with ENT.  Need to reschedule.  See their note for details.  No abdominal pain or cramping.  Bowels doing better.     Past Medical History  Diagnosis Date  . H/O: depression   . History of chicken pox   . Glaucoma   . Thyroid cancer ([place])   . Depression   . Thyroid disease   . Palpitations    Past Surgical History  Procedure Laterality Date  . Appendectomy    . Laparoscopic hysterectomy  1970    ovaries left in place  . Abdominal hysterectomy    . Tonsillectomy  1952  . Thyroidectomy     Family History  Problem Relation Age of Onset  . Cancer Mother     multiple lyeloma  . Cancer - Lung Brother     x 2  . Parkinson's disease Father   . Breast cancer Neg Hx    Social History   Social History  . Marital Status: Married    Spouse Name: N/A  . Number of Children: N/A  . Years of Education: N/A   Social History Main Topics  . Smoking status: Never Smoker   . Smokeless tobacco: Never Used  . Alcohol Use: No  . Drug Use: No  . Sexual Activity: Not Asked   Other Topics Concern  . None   Social History Narrative    Outpatient Encounter Prescriptions as of 04/16/2015  Medication Sig  . aspirin EC 81 MG tablet Take 81 mg by mouth daily.  Marland Kitchen levothyroxine (SYNTHROID, LEVOTHROID) 75 MCG tablet TAKE 1 TABLET (75 MCG TOTAL) BY MOUTH DAILY.  Marland Kitchen  liothyronine (CYTOMEL) 5 MCG tablet TAKE 1.5 TABLETS (7.5 MCG TOTAL) BY MOUTH DAILY.  . Multiple Vitamins-Minerals (CENTRUM) tablet Take 1 tablet by mouth daily.  . Multiple Vitamins-Minerals (PRESERVISION/LUTEIN) CAPS Take by mouth 2 (two) times daily.  . sertraline (ZOLOFT) 50 MG tablet TAKE 1 AND  1/2 TABLET PER DAY  . timolol (BETIMOL) 0.5 % ophthalmic solution Place 1 drop into both eyes 2 (two) times daily.   No facility-administered encounter medications on file as of 04/16/2015.    Review of Systems  Constitutional:       Appetite better.  Weight is relatively stable from last check.    HENT: Negative for congestion and sinus pressure.   Eyes: Negative for discharge and visual disturbance.  Respiratory: Negative for cough, chest tightness and shortness of breath.   Cardiovascular: Negative for chest pain and palpitations.  Gastrointestinal: Negative for nausea, vomiting, abdominal pain and diarrhea.  Genitourinary: Negative for dysuria and difficulty urinating.  Musculoskeletal: Negative for back pain and joint swelling.  Skin: Negative for color change and rash.  Neurological: Negative for dizziness, light-headedness and headaches.  Psychiatric/Behavioral: Negative for dysphoric mood and agitation.       Doing better.         Objective:     Blood pressure rechecked by me:  134/68  Physical Exam  Constitutional: She appears well-developed and well-nourished. No distress.  HENT:  Nose: Nose normal.  Mouth/Throat: Oropharynx is clear and moist.  Eyes: Conjunctivae are normal. Right eye exhibits no discharge. Left eye exhibits no discharge.  Neck: Neck supple. No thyromegaly present.  Cardiovascular: Normal rate and regular rhythm.   Pulmonary/Chest: Breath sounds normal. No respiratory distress. She has no wheezes.  Abdominal: Soft. Bowel sounds are normal. There is no tenderness.  Musculoskeletal: She exhibits no edema or tenderness.  Lymphadenopathy:    She has no  cervical adenopathy.  Skin: No rash noted. No erythema.  Psychiatric: She has a normal mood and affect. Her behavior is normal.    BP 120/62 mmHg  Pulse 53  Temp(Src) 97.6 F (36.4 C) (Oral)  Resp 18  Ht 5\' 1"  (1.549 m)  Wt 111 lb (50.349 kg)  BMI 20.98 kg/m2  SpO2 96% Wt Readings from Last 3 Encounters:  04/16/15 111 lb (50.349 kg)  01/11/15 112 lb 2 oz (50.86 kg)  06/28/14 114 lb 4 oz (51.823 kg)     Lab Results  Component Value Date   WBC 8.3 02/07/2015   HGB 12.2 02/07/2015   HCT 36.9 02/07/2015   PLT 241.0 02/07/2015   GLUCOSE 83 02/07/2015   CHOL 229* 02/07/2015   TRIG 68.0 02/07/2015   HDL 66.30 02/07/2015   LDLCALC 149* 02/07/2015   ALT 17 02/07/2015   AST 19 02/07/2015   NA 139 02/07/2015   K 4.1 02/07/2015   CL 104 02/07/2015   CREATININE 0.83 02/07/2015   BUN 21 02/07/2015   CO2 32 02/07/2015   TSH 2.23 02/07/2015   HGBA1C 5.8 02/07/2015    Mm Screening Breast Tomo Bilateral  04/15/2015  CLINICAL DATA:  Screening. EXAM: DIGITAL SCREENING BILATERAL MAMMOGRAM WITH 3D TOMO WITH CAD COMPARISON:  Previous exam(s). ACR Breast Density Category c: The breast tissue is heterogeneously dense, which may obscure small masses. FINDINGS: There are no findings suspicious for malignancy. Images were processed with CAD. IMPRESSION: No mammographic evidence of malignancy. A result letter of this screening mammogram will be mailed directly to the patient. RECOMMENDATION: Screening mammogram in one year. (Code:SM-B-01Y) BI-RADS CATEGORY  1: Negative. Electronically Signed   By: Altamese Cabal M.D.   On: 04/15/2015 14:08       Assessment & Plan:   Problem List Items Addressed This Visit    Abnormal liver function tests    Recheck liver panel with labs today.        Relevant Orders   Hepatic function panel   Anemia    hgb 02/07/15 - wnl (12.3).       Constipation    Bowels are doing better.        Depression    On zoloft.  Doing better.  Has good support.   Does not feel she needs any further intervention.  Follow.       Hypercholesterolemia    Low cholesterol diet and exercise.   Recheck lipid panel with next labs.        Relevant Orders   Lipid panel   Loss of weight    She is eating better.  Appetite better.  Food tasting better.  Weight relatively stable from last check.  Follow.  Lump in neck - Primary    Saw ENT.  Found to have lingual hypertrophy.  With recent deaths in her family, she reports she has not followed up with ENT.  They had recommended f/u laryngoscopy.  Will get ENT appt rescheduled.        Relevant Orders   Ambulatory referral to ENT   Thyroid cancer ([place])    On cytomel and synthroid.  Follow tsh.  Has been followed by Dr Ma Hillock.         Other Visit Diagnoses    Hyperglycemia        Relevant Orders    Hemoglobin Z6X    Basic metabolic panel        Einar Pheasant, MD

## 2015-04-16 NOTE — Assessment & Plan Note (Signed)
Saw ENT.  Found to have lingual hypertrophy.  With recent deaths in her family, she reports she has not followed up with ENT.  They had recommended f/u laryngoscopy.  Will get ENT appt rescheduled.

## 2015-06-06 ENCOUNTER — Other Ambulatory Visit: Payer: Self-pay | Admitting: Internal Medicine

## 2015-06-06 NOTE — Telephone Encounter (Signed)
ok'd refill for zoloft #45 with 2 refills.

## 2015-06-06 NOTE — Telephone Encounter (Signed)
Please advise refill? 

## 2015-06-18 ENCOUNTER — Other Ambulatory Visit: Payer: Medicare Other

## 2015-06-21 ENCOUNTER — Ambulatory Visit: Payer: Medicare Other | Admitting: Internal Medicine

## 2015-06-26 ENCOUNTER — Other Ambulatory Visit (INDEPENDENT_AMBULATORY_CARE_PROVIDER_SITE_OTHER): Payer: Medicare Other

## 2015-06-26 DIAGNOSIS — R945 Abnormal results of liver function studies: Secondary | ICD-10-CM

## 2015-06-26 DIAGNOSIS — E7800 Pure hypercholesterolemia, unspecified: Secondary | ICD-10-CM

## 2015-06-26 DIAGNOSIS — R739 Hyperglycemia, unspecified: Secondary | ICD-10-CM

## 2015-06-26 DIAGNOSIS — R7989 Other specified abnormal findings of blood chemistry: Secondary | ICD-10-CM

## 2015-06-26 LAB — HEPATIC FUNCTION PANEL
ALBUMIN: 3.8 g/dL (ref 3.5–5.2)
ALT: 14 U/L (ref 0–35)
AST: 17 U/L (ref 0–37)
Alkaline Phosphatase: 55 U/L (ref 39–117)
Bilirubin, Direct: 0.1 mg/dL (ref 0.0–0.3)
Total Bilirubin: 0.5 mg/dL (ref 0.2–1.2)
Total Protein: 6.6 g/dL (ref 6.0–8.3)

## 2015-06-26 LAB — BASIC METABOLIC PANEL
BUN: 20 mg/dL (ref 6–23)
CALCIUM: 9.5 mg/dL (ref 8.4–10.5)
CO2: 32 mEq/L (ref 19–32)
CREATININE: 0.82 mg/dL (ref 0.40–1.20)
Chloride: 101 mEq/L (ref 96–112)
GFR: 70.81 mL/min (ref >60.00)
Glucose, Bld: 94 mg/dL (ref 70–99)
Potassium: 4.3 mEq/L (ref 3.5–5.1)
Sodium: 137 mEq/L (ref 135–145)

## 2015-06-26 LAB — LIPID PANEL
CHOLESTEROL: 242 mg/dL — AB (ref 0–200)
HDL: 65.2 mg/dL (ref >39.00)
LDL Cholesterol: 162 mg/dL — ABNORMAL HIGH (ref 0–99)
NonHDL: 176.69
TRIGLYCERIDES: 71 mg/dL (ref 0.0–149.0)
Total CHOL/HDL Ratio: 4
VLDL: 14.2 mg/dL (ref 0.0–40.0)

## 2015-06-26 LAB — HEMOGLOBIN A1C: HEMOGLOBIN A1C: 6 % (ref 4.6–6.5)

## 2015-07-03 ENCOUNTER — Encounter: Payer: Self-pay | Admitting: Internal Medicine

## 2015-07-03 ENCOUNTER — Ambulatory Visit (INDEPENDENT_AMBULATORY_CARE_PROVIDER_SITE_OTHER): Payer: Medicare Other | Admitting: Internal Medicine

## 2015-07-03 ENCOUNTER — Other Ambulatory Visit: Payer: Self-pay | Admitting: Internal Medicine

## 2015-07-03 VITALS — BP 128/60 | HR 74 | Temp 98.2°F | Resp 18 | Ht 61.0 in | Wt 113.8 lb

## 2015-07-03 DIAGNOSIS — R739 Hyperglycemia, unspecified: Secondary | ICD-10-CM

## 2015-07-03 DIAGNOSIS — E7800 Pure hypercholesterolemia, unspecified: Secondary | ICD-10-CM | POA: Diagnosis not present

## 2015-07-03 DIAGNOSIS — R7989 Other specified abnormal findings of blood chemistry: Secondary | ICD-10-CM

## 2015-07-03 DIAGNOSIS — R634 Abnormal weight loss: Secondary | ICD-10-CM | POA: Diagnosis not present

## 2015-07-03 DIAGNOSIS — R945 Abnormal results of liver function studies: Secondary | ICD-10-CM

## 2015-07-03 DIAGNOSIS — R221 Localized swelling, mass and lump, neck: Secondary | ICD-10-CM

## 2015-07-03 DIAGNOSIS — F32A Depression, unspecified: Secondary | ICD-10-CM

## 2015-07-03 DIAGNOSIS — C73 Malignant neoplasm of thyroid gland: Secondary | ICD-10-CM

## 2015-07-03 DIAGNOSIS — F329 Major depressive disorder, single episode, unspecified: Secondary | ICD-10-CM

## 2015-07-03 DIAGNOSIS — D649 Anemia, unspecified: Secondary | ICD-10-CM

## 2015-07-03 NOTE — Progress Notes (Signed)
Patient ID: Paige Farmer, female   DOB: 04/08/1931, 82 y.o.   MRN: [id number]   Subjective:    Patient ID: Paige Farmer, female    DOB: 15-Jul-1930, 82 y.o.   MRN: [id number]  HPI  Patient with past history of thyroid cancer, depression, anemia and hypercholesterolemia.  She comes in today to follow up on these issues.  She reports her appetite is better.  Weight is stable.  Up one pound from the previous check.  Tries to stay active.  No cardiac symptoms with increased activity or exertion.  No sob.  No acid reflux reported.  No abdominal pain or cramping.  Bowels stable.    Past Medical History  Diagnosis Date  . H/O: depression   . History of chicken pox   . Glaucoma   . Thyroid cancer ([place])   . Depression   . Thyroid disease   . Palpitations    Past Surgical History  Procedure Laterality Date  . Appendectomy    . Laparoscopic hysterectomy  1970    ovaries left in place  . Abdominal hysterectomy    . Tonsillectomy  1952  . Thyroidectomy     Family History  Problem Relation Age of Onset  . Cancer Mother     multiple lyeloma  . Cancer - Lung Brother     x 2  . Parkinson's disease Father   . Breast cancer Neg Hx    Social History   Social History  . Marital Status: Married    Spouse Name: N/A  . Number of Children: N/A  . Years of Education: N/A   Social History Main Topics  . Smoking status: Never Smoker   . Smokeless tobacco: Never Used  . Alcohol Use: No  . Drug Use: No  . Sexual Activity: Not Asked   Other Topics Concern  . None   Social History Narrative    Outpatient Encounter Prescriptions as of 07/03/2015  Medication Sig  . aspirin EC 81 MG tablet Take 81 mg by mouth daily.  Marland Kitchen levothyroxine (SYNTHROID, LEVOTHROID) 75 MCG tablet TAKE 1 TABLET (75 MCG TOTAL) BY MOUTH DAILY.  Marland Kitchen liothyronine (CYTOMEL) 5 MCG tablet TAKE 1.5 TABLETS (7.5 MCG TOTAL) BY MOUTH DAILY.  . Multiple Vitamins-Minerals (CENTRUM) tablet Take 1 tablet by mouth daily.  .  Multiple Vitamins-Minerals (PRESERVISION/LUTEIN) CAPS Take by mouth 2 (two) times daily.  . sertraline (ZOLOFT) 50 MG tablet TAKE 1 AND  1/2 TABLET PER DAY  . sertraline (ZOLOFT) 50 MG tablet TAKE 1 AND  1/2 TABLET PER DAY  . timolol (BETIMOL) 0.5 % ophthalmic solution Place 1 drop into both eyes 2 (two) times daily.   No facility-administered encounter medications on file as of 07/03/2015.    Review of Systems  Constitutional: Negative for appetite change and unexpected weight change.  HENT: Negative for congestion and sinus pressure.   Eyes: Negative for discharge and redness.  Respiratory: Negative for cough, chest tightness and shortness of breath.   Cardiovascular: Negative for chest pain, palpitations and leg swelling.  Gastrointestinal: Negative for nausea, vomiting, abdominal pain and diarrhea.  Genitourinary: Negative for dysuria and difficulty urinating.  Musculoskeletal: Negative for back pain and joint swelling.  Skin: Negative for color change and rash.  Neurological: Negative for dizziness, light-headedness and headaches.  Psychiatric/Behavioral: Negative for dysphoric mood and agitation.       Objective:    Physical Exam  Constitutional: She appears well-developed and well-nourished. No distress.  HENT:  Nose: Nose normal.  Mouth/Throat: Oropharynx is clear and moist.  Eyes: Conjunctivae are normal. Right eye exhibits no discharge. Left eye exhibits no discharge.  Neck: Neck supple.  Cardiovascular: Normal rate and regular rhythm.   Pulmonary/Chest: Breath sounds normal. No respiratory distress. She has no wheezes.  Abdominal: Soft. Bowel sounds are normal. There is no tenderness.  Musculoskeletal: She exhibits no edema or tenderness.  Lymphadenopathy:    She has no cervical adenopathy.  Skin: No rash noted. No erythema.  Psychiatric: She has a normal mood and affect. Her behavior is normal.    BP 128/60 mmHg  Pulse 74  Temp(Src) 98.2 F (36.8 C) (Oral)   Resp 18  Ht 5\' 1"  (1.549 m)  Wt 113 lb 12 oz (51.597 kg)  BMI 21.50 kg/m2  SpO2 94% Wt Readings from Last 3 Encounters:  07/03/15 113 lb 12 oz (51.597 kg)  04/16/15 111 lb (50.349 kg)  01/11/15 112 lb 2 oz (50.86 kg)     Lab Results  Component Value Date   WBC 8.3 02/07/2015   HGB 12.2 02/07/2015   HCT 36.9 02/07/2015   PLT 241.0 02/07/2015   GLUCOSE 94 06/26/2015   CHOL 242* 06/26/2015   TRIG 71.0 06/26/2015   HDL 65.20 06/26/2015   LDLCALC 162* 06/26/2015   ALT 14 06/26/2015   AST 17 06/26/2015   NA 137 06/26/2015   K 4.3 06/26/2015   CL 101 06/26/2015   CREATININE 0.82 06/26/2015   BUN 20 06/26/2015   CO2 32 06/26/2015   TSH 2.23 02/07/2015   HGBA1C 6.0 06/26/2015    Mm Screening Breast Tomo Bilateral  04/15/2015  CLINICAL DATA:  Screening. EXAM: DIGITAL SCREENING BILATERAL MAMMOGRAM WITH 3D TOMO WITH CAD COMPARISON:  Previous exam(s). ACR Breast Density Category c: The breast tissue is heterogeneously dense, which may obscure small masses. FINDINGS: There are no findings suspicious for malignancy. Images were processed with CAD. IMPRESSION: No mammographic evidence of malignancy. A result letter of this screening mammogram will be mailed directly to the patient. RECOMMENDATION: Screening mammogram in one year. (Code:SM-B-01Y) BI-RADS CATEGORY  1: Negative. Electronically Signed   By: Altamese Cabal M.D.   On: 04/15/2015 14:08       Assessment & Plan:   Problem List Items Addressed This Visit    Abnormal liver function tests    06/26/15 liver panel wnl.        Anemia    Last hgb check wnl.  Follow cbc.       Depression    On zoloft.  Feeling better.  Eating better.  Follow.        Hypercholesterolemia - Primary    Cholesterol results reviewed with her.  She desires not to take cholesterol medication.  Fish oil.  Follow.        Relevant Orders   Lipid panel   Hepatic function panel   Basic metabolic panel   Loss of weight    Appetite is better.   Weight stable.  Follow.       Lump in neck    Saw ENT.  Found to have lingual hypertrophy.  States had f/u.  Obtain records.        Thyroid cancer ([place])    On cytomel and synthroid.  Has been followed by Dr Ma Hillock.  Overdue f/u.  Schedule.        Relevant Orders   Ambulatory referral to Oncology   TSH    Other Visit Diagnoses    Hyperglycemia  Relevant Orders    Hemoglobin A1c        Einar Pheasant, MD

## 2015-07-03 NOTE — Patient Instructions (Signed)
Saline nasal spray - flush nose at least 2-3x/day  nasacort nasal spray - 2 sprays each nostril one time per day.  Do this in the evening.  Let me know if the ear continues to bother you.

## 2015-07-03 NOTE — Progress Notes (Signed)
Pre-visit discussion using our clinic review tool. No additional management support is needed unless otherwise documented below in the visit note.  

## 2015-07-09 ENCOUNTER — Telehealth: Payer: Self-pay | Admitting: *Deleted

## 2015-07-09 ENCOUNTER — Encounter: Payer: Self-pay | Admitting: Internal Medicine

## 2015-07-09 NOTE — Assessment & Plan Note (Signed)
Appetite is better.  Weight stable.  Follow.

## 2015-07-09 NOTE — Assessment & Plan Note (Signed)
Saw ENT.  Found to have lingual hypertrophy.  States had f/u.  Obtain records.

## 2015-07-09 NOTE — Telephone Encounter (Signed)
-----   Message from Einar Pheasant, MD sent at 07/09/2015  5:35 AM EST ----- Regarding: need office note Need ENT notes.  Last few office notes.  She has been seeing Dr Pryor Ochoa for neck nodule.  Thanks    Dr Nicki Reaper

## 2015-07-09 NOTE — Telephone Encounter (Signed)
Records requested electronically  

## 2015-07-09 NOTE — Assessment & Plan Note (Signed)
Cholesterol results reviewed with her.  She desires not to take cholesterol medication.  Fish oil.  Follow.

## 2015-07-09 NOTE — Assessment & Plan Note (Signed)
On cytomel and synthroid.  Has been followed by Dr Ma Hillock.  Overdue f/u.  Schedule.

## 2015-07-09 NOTE — Assessment & Plan Note (Signed)
On zoloft.  Feeling better.  Eating better.  Follow.

## 2015-07-09 NOTE — Assessment & Plan Note (Signed)
06/26/15 liver panel wnl.

## 2015-07-09 NOTE — Assessment & Plan Note (Signed)
Last hgb check wnl.  Follow cbc.

## 2015-07-15 ENCOUNTER — Encounter: Payer: Self-pay | Admitting: *Deleted

## 2015-07-18 ENCOUNTER — Encounter: Payer: Self-pay | Admitting: Internal Medicine

## 2015-07-18 ENCOUNTER — Inpatient Hospital Stay: Payer: Medicare Other

## 2015-07-18 ENCOUNTER — Inpatient Hospital Stay: Payer: Medicare Other | Attending: Internal Medicine | Admitting: Internal Medicine

## 2015-07-18 VITALS — BP 143/65 | HR 64 | Temp 97.3°F | Ht 61.0 in | Wt 112.4 lb

## 2015-07-18 DIAGNOSIS — Z803 Family history of malignant neoplasm of breast: Secondary | ICD-10-CM | POA: Diagnosis not present

## 2015-07-18 DIAGNOSIS — Z85850 Personal history of malignant neoplasm of thyroid: Secondary | ICD-10-CM | POA: Insufficient documentation

## 2015-07-18 DIAGNOSIS — E890 Postprocedural hypothyroidism: Secondary | ICD-10-CM | POA: Insufficient documentation

## 2015-07-18 DIAGNOSIS — Z79899 Other long term (current) drug therapy: Secondary | ICD-10-CM | POA: Diagnosis not present

## 2015-07-18 DIAGNOSIS — Z801 Family history of malignant neoplasm of trachea, bronchus and lung: Secondary | ICD-10-CM | POA: Insufficient documentation

## 2015-07-18 DIAGNOSIS — E079 Disorder of thyroid, unspecified: Secondary | ICD-10-CM | POA: Insufficient documentation

## 2015-07-18 DIAGNOSIS — M8580 Other specified disorders of bone density and structure, unspecified site: Secondary | ICD-10-CM | POA: Diagnosis not present

## 2015-07-18 DIAGNOSIS — C73 Malignant neoplasm of thyroid gland: Secondary | ICD-10-CM

## 2015-07-18 DIAGNOSIS — Z7982 Long term (current) use of aspirin: Secondary | ICD-10-CM | POA: Diagnosis not present

## 2015-07-18 LAB — TSH: TSH: 5.652 u[IU]/mL — AB (ref 0.350–4.500)

## 2015-07-18 NOTE — Progress Notes (Signed)
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 Synthroid 153mg/d + cytomel 5 mc/d; CT Oct 2015- NEG  INTERVAL HISTORY:  This is my first interaction with the patient since I joined the practice September 2016. I reviewed the patient's prior charts/pertinent labs/imaging in detail; findings are summarized above.   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  Patient many months ago had complained of hoarseness of voice- that was worked up by Dr.Vaught; no recurrent malignancy was found.  Otherwise patient's appetite is good. Denies any new lumps or bumps in the neck. No new shortness of breath or chest pain.   REVIEW OF SYSTEMS:  A complete 10 point review of system is done which is negative except mentioned above/history of present illness.   PAST MEDICAL HISTORY :  Past Medical History  Diagnosis Date  . H/O: depression   . History of chicken pox   . Glaucoma   . Thyroid cancer [place]   . Depression   . Thyroid disease   . Palpitations     PAST SURGICAL HISTORY :   Past Surgical History  Procedure Laterality Date  . Appendectomy    . Laparoscopic hysterectomy  1970    ovaries left in place  . Abdominal hysterectomy    . Tonsillectomy  1952  . Thyroidectomy      FAMILY HISTORY :   Family History  Problem Relation Age of Onset  . Cancer Mother     multiple myeloma  . Cancer - Lung Brother     x 2  . Parkinson's disease Father   . Breast cancer Neg Hx     SOCIAL HISTORY:   Social History  Substance Use Topics  . Smoking status: Never Smoker   . Smokeless tobacco: Never  Used  . Alcohol Use: No    ALLERGIES:  is allergic to doxepin.  MEDICATIONS:  Current Outpatient Prescriptions  Medication Sig Dispense Refill  . aspirin EC 81 MG tablet Take 81 mg by mouth daily.    .Marland Kitchenlevothyroxine (SYNTHROID, LEVOTHROID) 75 MCG tablet TAKE 1 TABLET (75 MCG TOTAL) BY MOUTH DAILY. 30 tablet 4  . liothyronine (CYTOMEL) 5 MCG tablet TAKE 1.5 TABLETS (7.5 MCG TOTAL) BY MOUTH DAILY. 45 tablet 10  . Multiple Vitamins-Minerals (CENTRUM) tablet Take 1 tablet by mouth daily.    . Multiple Vitamins-Minerals (PRESERVISION/LUTEIN) CAPS Take by mouth 2 (two) times daily.    . sertraline (ZOLOFT) 50 MG tablet TAKE 1 AND  1/2 TABLET PER DAY 45 tablet 1  . timolol (BETIMOL) 0.5 % ophthalmic solution Place 1 drop into both eyes 2 (two) times daily.     No current facility-administered medications for this visit.    PHYSICAL EXAMINATION: ECOG PERFORMANCE STATUS: 0  BP 143/65 mmHg  Pulse 64  Temp(Src) 97.3 F (36.3 C) (Tympanic)  Ht _0  (1.549 m)  Wt 112 lb 7 oz (51 kg)  BMI 21.26 kg/m2  Filed Weights   07/18/15 0855  Weight: 112 lb 7 oz (51 kg)    GENERAL: Well-nourished well-developed; Alert, no distress and comfortable.  Accompanied by his son.  EYES: no pallor or icterus OROPHARYNX: no thrush or  ulceration; good dentition  NECK: supple, no masses felt; thyroidectomy scar noted. LYMPH:  no palpable lymphadenopathy in the cervical, axillary or inguinal regions LUNGS: clear to auscultation and  No wheeze or crackles HEART/CVS: regular rate & rhythm and no murmurs; No lower extremity edema ABDOMEN:abdomen soft, non-tender and normal bowel sounds Musculoskeletal:no cyanosis of digits and no clubbing  PSYCH: alert & oriented x 3 with fluent speech NEURO: no focal motor/sensory deficits SKIN:  no rashes or significant lesions  LABORATORY DATA:  I have reviewed the data as listed    Component Value Date/Time   NA 137 06/26/2015 0814   NA 141 08/08/2012 1012   NA  141 06/09/2011   K 4.3 06/26/2015 0814   K 3.8 08/08/2012 1012   CL 101 06/26/2015 0814   CL 104 08/08/2012 1012   CO2 32 06/26/2015 0814   CO2 32 08/08/2012 1012   GLUCOSE 94 06/26/2015 0814   GLUCOSE 93 08/08/2012 1012   BUN 20 06/26/2015 0814   BUN 18 08/08/2012 1012   BUN 17 06/09/2011   CREATININE 0.82 06/26/2015 0814   CREATININE 0.85 03/27/2014 1142   CREATININE 0.8 06/09/2011   CALCIUM 9.5 06/26/2015 0814   CALCIUM 8.9 08/08/2012 1012   PROT 6.6 06/26/2015 0814   PROT 6.5 08/08/2012 1012   ALBUMIN 3.8 06/26/2015 0814   ALBUMIN 3.5 08/08/2012 1012   AST 17 06/26/2015 0814   AST 17 08/08/2012 1012   ALT 14 06/26/2015 0814   ALT 18 08/08/2012 1012   ALKPHOS 55 06/26/2015 0814   ALKPHOS 63 08/08/2012 1012   BILITOT 0.5 06/26/2015 0814   BILITOT 0.4 08/08/2012 1012   GFRNONAA >60 03/27/2014 1142   GFRNONAA >60 08/08/2012 1012   GFRAA >60 03/27/2014 1142   GFRAA >60 08/08/2012 1012    No results found for: SPEP, UPEP  Lab Results  Component Value Date   WBC 8.3 02/07/2015   NEUTROABS 4.2 02/07/2015   HGB 12.2 02/07/2015   HCT 36.9 02/07/2015   MCV 91.5 02/07/2015   PLT 241.0 02/07/2015      Chemistry      Component Value Date/Time   NA 137 06/26/2015 0814   NA 141 08/08/2012 1012   NA 141 06/09/2011   K 4.3 06/26/2015 0814   K 3.8 08/08/2012 1012   CL 101 06/26/2015 0814   CL 104 08/08/2012 1012   CO2 32 06/26/2015 0814   CO2 32 08/08/2012 1012   BUN 20 06/26/2015 0814   BUN 18 08/08/2012 1012   BUN 17 06/09/2011   CREATININE 0.82 06/26/2015 0814   CREATININE 0.85 03/27/2014 1142   CREATININE 0.8 06/09/2011   GLU 95 06/09/2011      Component Value Date/Time   CALCIUM 9.5 06/26/2015 0814   CALCIUM 8.9 08/08/2012 1012   ALKPHOS 55 06/26/2015 0814   ALKPHOS 63 08/08/2012 1012   AST 17 06/26/2015 0814   AST 17 08/08/2012 1012   ALT 14 06/26/2015 0814   ALT 18 08/08/2012 1012   BILITOT 0.5 06/26/2015 0814   BILITOT 0.4 08/08/2012 1012        ASSESSMENT & PLAN:   # Thyroid cancer T2 NX; status post RAI U [2007]- currently on Synthroid 141mg/d + cytomel 5 mc/d . Clinically no evidence of recurrence noted.  Most recent TSH in October 2016- 2.23. I recommend repeating the TSH today.   # DEXA scan January 2014- osteopenia. Recommend repeat DEXA scan.   # Patient follow-up with me with labs CBC  CMP; TSH in 6 months.   #15 minutes face-to-face with the patient/son discussing the above plan of care; more than 50% of time spent on prognosis/ natural history; counseling and coordination.     Cammie Sickle, MD 07/18/2015 9:06 AM

## 2015-07-19 LAB — T3: T3, Total: 110 ng/dL (ref 71–180)

## 2015-07-19 LAB — T4: T4 TOTAL: 7.6 ug/dL (ref 4.5–12.0)

## 2015-07-22 ENCOUNTER — Telehealth: Payer: Self-pay | Admitting: Internal Medicine

## 2015-07-22 DIAGNOSIS — C73 Malignant neoplasm of thyroid gland: Secondary | ICD-10-CM

## 2015-07-22 NOTE — Telephone Encounter (Signed)
Spoke with patient. Apt provided for 01/06/16 for lab draw. Teach back process performed with the patient.

## 2015-07-22 NOTE — Telephone Encounter (Signed)
I Spoke to the patient regarding the results of the TSH; slightly high at 5.6; but T3-T4 within normal limits. Patient has been treated for thyroid cancer more than 7 years ago. Given her age I would not want too tight control of the TSH. I think it's reasonable to monitor her TSH T3-T4/thyroglobulin in 6 months. She will continue the Synthroid and Cytomel at the current dose. She agrees.   Heather- please remind that she needs to have her labs done 1 week prior to next visit.

## 2015-08-08 ENCOUNTER — Other Ambulatory Visit: Payer: Self-pay | Admitting: Internal Medicine

## 2015-08-08 DIAGNOSIS — Z760 Encounter for issue of repeat prescription: Secondary | ICD-10-CM

## 2015-08-08 NOTE — Telephone Encounter (Signed)
Spoke with patient and she reports no changes to dose and is still taking.

## 2015-08-08 NOTE — Telephone Encounter (Signed)
Please confirm with pt that she is still taking this dose and no one has changed.  Was seen over at cancer center for her thyroid.  If taking, then ok to refill x 4

## 2015-09-06 ENCOUNTER — Other Ambulatory Visit: Payer: Self-pay | Admitting: Internal Medicine

## 2015-09-06 DIAGNOSIS — Z760 Encounter for issue of repeat prescription: Secondary | ICD-10-CM

## 2015-09-06 NOTE — Telephone Encounter (Signed)
okd refill zoloft #45 with two refills.

## 2015-09-22 ENCOUNTER — Other Ambulatory Visit: Payer: Self-pay | Admitting: Internal Medicine

## 2015-10-01 ENCOUNTER — Other Ambulatory Visit (INDEPENDENT_AMBULATORY_CARE_PROVIDER_SITE_OTHER): Payer: Medicare Other

## 2015-10-01 DIAGNOSIS — C73 Malignant neoplasm of thyroid gland: Secondary | ICD-10-CM | POA: Diagnosis not present

## 2015-10-01 DIAGNOSIS — R739 Hyperglycemia, unspecified: Secondary | ICD-10-CM

## 2015-10-01 DIAGNOSIS — E7800 Pure hypercholesterolemia, unspecified: Secondary | ICD-10-CM

## 2015-10-01 LAB — BASIC METABOLIC PANEL
BUN: 21 mg/dL (ref 6–23)
CALCIUM: 9.4 mg/dL (ref 8.4–10.5)
CO2: 32 mEq/L (ref 19–32)
Chloride: 102 mEq/L (ref 96–112)
Creatinine, Ser: 0.83 mg/dL (ref 0.40–1.20)
GFR: 69.78 mL/min (ref >60.00)
Glucose, Bld: 92 mg/dL (ref 70–99)
Potassium: 3.9 mEq/L (ref 3.5–5.1)
SODIUM: 139 meq/L (ref 135–145)

## 2015-10-01 LAB — LIPID PANEL
CHOL/HDL RATIO: 3
CHOLESTEROL: 219 mg/dL — AB (ref 0–200)
HDL: 68.1 mg/dL (ref >39.00)
LDL CALC: 139 mg/dL — AB (ref 0–99)
NonHDL: 150.69
TRIGLYCERIDES: 60 mg/dL (ref 0.0–149.0)
VLDL: 12 mg/dL (ref 0.0–40.0)

## 2015-10-01 LAB — HEPATIC FUNCTION PANEL
ALBUMIN: 3.8 g/dL (ref 3.5–5.2)
ALT: 13 U/L (ref 0–35)
AST: 17 U/L (ref 0–37)
Alkaline Phosphatase: 50 U/L (ref 39–117)
Bilirubin, Direct: 0 mg/dL (ref 0.0–0.3)
TOTAL PROTEIN: 6.6 g/dL (ref 6.0–8.3)
Total Bilirubin: 0.5 mg/dL (ref 0.2–1.2)

## 2015-10-01 LAB — TSH: TSH: 1.74 u[IU]/mL (ref 0.35–4.50)

## 2015-10-01 LAB — HEMOGLOBIN A1C: Hgb A1c MFr Bld: 6.1 % (ref 4.6–6.5)

## 2015-10-02 ENCOUNTER — Encounter: Payer: Self-pay | Admitting: Internal Medicine

## 2015-10-03 ENCOUNTER — Ambulatory Visit (INDEPENDENT_AMBULATORY_CARE_PROVIDER_SITE_OTHER): Payer: Medicare Other | Admitting: Internal Medicine

## 2015-10-03 ENCOUNTER — Encounter: Payer: Self-pay | Admitting: Internal Medicine

## 2015-10-03 VITALS — BP 122/82 | HR 70 | Temp 98.2°F | Resp 18 | Ht 61.0 in | Wt 112.0 lb

## 2015-10-03 DIAGNOSIS — R739 Hyperglycemia, unspecified: Secondary | ICD-10-CM

## 2015-10-03 DIAGNOSIS — R634 Abnormal weight loss: Secondary | ICD-10-CM | POA: Diagnosis not present

## 2015-10-03 DIAGNOSIS — C73 Malignant neoplasm of thyroid gland: Secondary | ICD-10-CM | POA: Diagnosis not present

## 2015-10-03 DIAGNOSIS — E7800 Pure hypercholesterolemia, unspecified: Secondary | ICD-10-CM

## 2015-10-03 DIAGNOSIS — R221 Localized swelling, mass and lump, neck: Secondary | ICD-10-CM | POA: Diagnosis not present

## 2015-10-03 DIAGNOSIS — F32A Depression, unspecified: Secondary | ICD-10-CM

## 2015-10-03 DIAGNOSIS — R7989 Other specified abnormal findings of blood chemistry: Secondary | ICD-10-CM

## 2015-10-03 DIAGNOSIS — R002 Palpitations: Secondary | ICD-10-CM | POA: Diagnosis not present

## 2015-10-03 DIAGNOSIS — R609 Edema, unspecified: Secondary | ICD-10-CM

## 2015-10-03 DIAGNOSIS — R945 Abnormal results of liver function studies: Secondary | ICD-10-CM

## 2015-10-03 DIAGNOSIS — F329 Major depressive disorder, single episode, unspecified: Secondary | ICD-10-CM

## 2015-10-03 NOTE — Assessment & Plan Note (Signed)
Most recent check wnl.   

## 2015-10-03 NOTE — Assessment & Plan Note (Signed)
Lower extremity swelling improved.   

## 2015-10-03 NOTE — Progress Notes (Signed)
Pre-visit discussion using our clinic review tool. No additional management support is needed unless otherwise documented below in the visit note.  

## 2015-10-03 NOTE — Assessment & Plan Note (Signed)
On zoloft.  Sleeping better.  Feels better.  Follow.

## 2015-10-03 NOTE — Assessment & Plan Note (Signed)
Cholesterol on recent check improved.  Follow.   Lab Results  Component Value Date   CHOL 219* 10/01/2015   HDL 68.10 10/01/2015   LDLCALC 139* 10/01/2015   TRIG 60.0 10/01/2015   CHOLHDL 3 10/01/2015

## 2015-10-03 NOTE — Assessment & Plan Note (Signed)
Weight stable last few checks.  Follow.  States has good appetite.

## 2015-10-03 NOTE — Assessment & Plan Note (Signed)
Recently evaluated at the cancer center.  Stable.  Continue to follow. Recent tsh wnl.

## 2015-10-03 NOTE — Progress Notes (Signed)
Patient ID: Paige Farmer, female   DOB: [birth year], 82 y.o.   MRN: [id number]   Subjective:    Patient ID: Paige Farmer, female    DOB: 1930/12/26, 82 y.o.   MRN: [id number]  HPI  Patient here for a scheduled follow up.  She is accompanied by her son.  History obtained from both of them.  She is eating well.  Appetite is good.  Watches what she eats.  No nausea or vomiting.  No abdominal pain or cramping.  Bowels stable.  No urine change.  She did fall two weeks ago.  A dog lunged at her and she was startled and fell backwards.  Right elbow healing.  Feels better.  No other injuries.  Is sleeping better.  Handling stress.  Has decided to go look at [facility].  Plans to move.     Past Medical History  Diagnosis Date  . H/O: depression   . History of chicken pox   . Glaucoma   . Thyroid cancer ([place])   . Depression   . Thyroid disease   . Palpitations   . Cancer [facility])     Thyroid   Past Surgical History  Procedure Laterality Date  . Appendectomy    . Laparoscopic hysterectomy  1970    ovaries left in place  . Abdominal hysterectomy    . Tonsillectomy  1952  . Thyroidectomy     Family History  Problem Relation Age of Onset  . Cancer Mother     multiple myeloma  . Cancer - Lung Brother     x 2  . Parkinson's disease Father   . Breast cancer Neg Hx    Social History   Social History  . Marital Status: Married    Spouse Name: N/A  . Number of Children: N/A  . Years of Education: N/A   Social History Main Topics  . Smoking status: Never Smoker   . Smokeless tobacco: Never Used  . Alcohol Use: No  . Drug Use: No  . Sexual Activity: Not Asked   Other Topics Concern  . None   Social History Narrative    Outpatient Encounter Prescriptions as of 10/03/2015  Medication Sig  . aspirin EC 81 MG tablet Take 81 mg by mouth daily.  Marland Kitchen levothyroxine (SYNTHROID, LEVOTHROID) 75 MCG tablet TAKE 1 TABLET (75 MCG TOTAL) BY MOUTH DAILY.  Marland Kitchen liothyronine (CYTOMEL) 5 MCG tablet  TAKE 1.5 TABLETS (7.5 MCG TOTAL) BY MOUTH DAILY.  . Multiple Vitamins-Minerals (CENTRUM) tablet Take 1 tablet by mouth daily.  . Multiple Vitamins-Minerals (PRESERVISION/LUTEIN) CAPS Take by mouth 2 (two) times daily.  . Omega-3 Fatty Acids (FISH OIL) 1000 MG CAPS Take by mouth.  . sertraline (ZOLOFT) 50 MG tablet TAKE 1 AND  1/2 TABLET PER DAY  . sertraline (ZOLOFT) 50 MG tablet TAKE 1 AND  1/2 TABLET PER DAY  . timolol (BETIMOL) 0.5 % ophthalmic solution Place 1 drop into both eyes 2 (two) times daily.   No facility-administered encounter medications on file as of 10/03/2015.    Review of Systems  Constitutional: Negative for appetite change and unexpected weight change.  HENT: Negative for congestion and sinus pressure.   Respiratory: Negative for cough, chest tightness and shortness of breath.   Cardiovascular: Negative for chest pain, palpitations and leg swelling.  Gastrointestinal: Negative for nausea, vomiting, abdominal pain and diarrhea.  Genitourinary: Negative for dysuria and difficulty urinating.  Musculoskeletal: Negative for back pain.  Right elbow doing better.    Skin: Negative for color change and rash.  Neurological: Negative for dizziness, light-headedness and headaches.  Psychiatric/Behavioral: Negative for dysphoric mood and agitation.       Objective:     Blood pressure rechecked by me:  136/78  Physical Exam  Constitutional: She appears well-developed and well-nourished. No distress.  HENT:  Nose: Nose normal.  Mouth/Throat: Oropharynx is clear and moist.  Neck: Neck supple.  Cardiovascular: Normal rate and regular rhythm.   Pulmonary/Chest: Breath sounds normal. No respiratory distress. She has no wheezes.  Abdominal: Soft. Bowel sounds are normal. There is no tenderness.  Musculoskeletal: She exhibits no edema or tenderness.  No pain with movement of her right elbow.  No increased soft tissue swelling.  Healing abrasion.   Lymphadenopathy:     She has no cervical adenopathy.  Skin: No rash noted. No erythema.  Psychiatric: She has a normal mood and affect. Her behavior is normal.    BP 122/82 mmHg  Pulse 70  Temp(Src) 98.2 F (36.8 C) (Oral)  Resp 18  Ht '5\' 1"'$  (1.549 m)  Wt 112 lb (50.803 kg)  BMI 21.17 kg/m2  SpO2 96% Wt Readings from Last 3 Encounters:  10/03/15 112 lb (50.803 kg)  07/18/15 112 lb 7 oz (51 kg)  07/03/15 113 lb 12 oz (51.597 kg)     Lab Results  Component Value Date   WBC 8.3 02/07/2015   HGB 12.2 02/07/2015   HCT 36.9 02/07/2015   PLT 241.0 02/07/2015   GLUCOSE 92 10/01/2015   CHOL 219* 10/01/2015   TRIG 60.0 10/01/2015   HDL 68.10 10/01/2015   LDLCALC 139* 10/01/2015   ALT 13 10/01/2015   AST 17 10/01/2015   NA 139 10/01/2015   K 3.9 10/01/2015   CL 102 10/01/2015   CREATININE 0.83 10/01/2015   BUN 21 10/01/2015   CO2 32 10/01/2015   TSH 1.74 10/01/2015   HGBA1C 6.1 10/01/2015    Mm Screening Breast Tomo Bilateral  04/15/2015  CLINICAL DATA:  Screening. EXAM: DIGITAL SCREENING BILATERAL MAMMOGRAM WITH 3D TOMO WITH CAD COMPARISON:  Previous exam(s). ACR Breast Density Category c: The breast tissue is heterogeneously dense, which may obscure small masses. FINDINGS: There are no findings suspicious for malignancy. Images were processed with CAD. IMPRESSION: No mammographic evidence of malignancy. A result letter of this screening mammogram will be mailed directly to the patient. RECOMMENDATION: Screening mammogram in one year. (Code:SM-B-01Y) BI-RADS CATEGORY  1: Negative. Electronically Signed   By: Altamese Cabal M.D.   On: 04/15/2015 14:08       Assessment & Plan:   Problem List Items Addressed This Visit    Abnormal liver function tests    Most recent check wnl.       Relevant Orders   Hepatic function panel   Depression    On zoloft.  Sleeping better.  Feels better.  Follow.       Edema    Lower extremity swelling improved.       Hypercholesterolemia     Cholesterol on recent check improved.  Follow.   Lab Results  Component Value Date   CHOL 219* 10/01/2015   HDL 68.10 10/01/2015   LDLCALC 139* 10/01/2015   TRIG 60.0 10/01/2015   CHOLHDL 3 10/01/2015        Relevant Orders   Lipid panel   Basic metabolic panel   Hyperglycemia    Recent a1c 6.1.  Follow.  Relevant Orders   Hemoglobin A1c   Loss of weight    Weight stable last few checks.  Follow.  States has good appetite.        Lump in neck    Saw ENT.  Found to have lingual hypertrophy.        Palpitations    Saw cardiology recently.  Note reviewed.  Stable.  No changes made.       Thyroid cancer ([place]) - Primary    Recently evaluated at the cancer center.  Stable.  Continue to follow. Recent tsh wnl.       Relevant Orders   CBC with Differential/Platelet       Einar Pheasant, MD

## 2015-10-03 NOTE — Assessment & Plan Note (Signed)
Recent a1c 6.1.  Follow.

## 2015-10-03 NOTE — Assessment & Plan Note (Signed)
Saw ENT.  Found to have lingual hypertrophy.

## 2015-10-03 NOTE — Assessment & Plan Note (Signed)
Saw cardiology recently.  Note reviewed.  Stable.  No changes made.

## 2015-10-19 ENCOUNTER — Other Ambulatory Visit: Payer: Self-pay | Admitting: Internal Medicine

## 2016-01-06 ENCOUNTER — Inpatient Hospital Stay: Payer: Medicare Other | Attending: Internal Medicine

## 2016-01-06 DIAGNOSIS — Z85850 Personal history of malignant neoplasm of thyroid: Secondary | ICD-10-CM | POA: Diagnosis present

## 2016-01-06 DIAGNOSIS — C73 Malignant neoplasm of thyroid gland: Secondary | ICD-10-CM

## 2016-01-06 LAB — CBC WITH DIFFERENTIAL/PLATELET
BASOS ABS: 0.1 10*3/uL (ref 0–0.1)
BASOS PCT: 1 %
EOS ABS: 0.2 10*3/uL (ref 0–0.7)
EOS PCT: 2 %
HCT: 36.2 % (ref 35.0–47.0)
Hemoglobin: 12.4 g/dL (ref 12.0–16.0)
LYMPHS PCT: 37 %
Lymphs Abs: 2.8 10*3/uL (ref 1.0–3.6)
MCH: 30.7 pg (ref 26.0–34.0)
MCHC: 34.2 g/dL (ref 32.0–36.0)
MCV: 89.6 fL (ref 80.0–100.0)
MONO ABS: 0.7 10*3/uL (ref 0.2–0.9)
Monocytes Relative: 9 %
Neutro Abs: 3.9 10*3/uL (ref 1.4–6.5)
Neutrophils Relative %: 51 %
PLATELETS: 260 10*3/uL (ref 150–440)
RBC: 4.03 MIL/uL (ref 3.80–5.20)
RDW: 14.9 % — AB (ref 11.5–14.5)
WBC: 7.5 10*3/uL (ref 3.6–11.0)

## 2016-01-06 LAB — COMPREHENSIVE METABOLIC PANEL
ALBUMIN: 4 g/dL (ref 3.5–5.0)
ALT: 17 U/L (ref 14–54)
AST: 21 U/L (ref 15–41)
Alkaline Phosphatase: 63 U/L (ref 38–126)
Anion gap: 4 — ABNORMAL LOW (ref 5–15)
BUN: 17 mg/dL (ref 6–20)
CHLORIDE: 101 mmol/L (ref 101–111)
CO2: 30 mmol/L (ref 22–32)
CREATININE: 0.74 mg/dL (ref 0.44–1.00)
Calcium: 8.9 mg/dL (ref 8.9–10.3)
GFR calc Af Amer: >60 mL/min (ref >60)
GFR calc non Af Amer: >60 mL/min (ref >60)
GLUCOSE: 98 mg/dL (ref 65–99)
POTASSIUM: 4 mmol/L (ref 3.5–5.1)
SODIUM: 135 mmol/L (ref 135–145)
Total Bilirubin: 0.7 mg/dL (ref 0.3–1.2)
Total Protein: 7.1 g/dL (ref 6.5–8.1)

## 2016-01-06 LAB — TSH: TSH: 2.936 u[IU]/mL (ref 0.350–4.500)

## 2016-01-07 LAB — THYROID PANEL WITH TSH
Free Thyroxine Index: 1.7 (ref 1.2–4.9)
T3 UPTAKE RATIO: 26 % (ref 24–39)
T4, Total: 6.4 ug/dL (ref 4.5–12.0)
TSH: 3.38 u[IU]/mL (ref 0.450–4.500)

## 2016-01-07 LAB — TGAB+THYROGLOBULIN IMA OR RIA: Thyroglobulin Antibody: <1 IU/mL (ref 0.0–0.9)

## 2016-01-07 LAB — THYROGLOBULIN BY IMA

## 2016-01-15 ENCOUNTER — Other Ambulatory Visit: Payer: Medicare Other

## 2016-01-15 ENCOUNTER — Ambulatory Visit: Payer: Medicare Other | Admitting: Internal Medicine

## 2016-01-31 ENCOUNTER — Other Ambulatory Visit (INDEPENDENT_AMBULATORY_CARE_PROVIDER_SITE_OTHER): Payer: Medicare Other

## 2016-01-31 DIAGNOSIS — C73 Malignant neoplasm of thyroid gland: Secondary | ICD-10-CM

## 2016-01-31 DIAGNOSIS — R739 Hyperglycemia, unspecified: Secondary | ICD-10-CM

## 2016-01-31 DIAGNOSIS — R7989 Other specified abnormal findings of blood chemistry: Secondary | ICD-10-CM | POA: Diagnosis not present

## 2016-01-31 DIAGNOSIS — E7800 Pure hypercholesterolemia, unspecified: Secondary | ICD-10-CM

## 2016-01-31 DIAGNOSIS — R945 Abnormal results of liver function studies: Secondary | ICD-10-CM

## 2016-01-31 LAB — HEPATIC FUNCTION PANEL
ALBUMIN: 3.8 g/dL (ref 3.5–5.2)
ALT: 12 U/L (ref 0–35)
AST: 15 U/L (ref 0–37)
Alkaline Phosphatase: 58 U/L (ref 39–117)
Bilirubin, Direct: 0.1 mg/dL (ref 0.0–0.3)
TOTAL PROTEIN: 6.6 g/dL (ref 6.0–8.3)
Total Bilirubin: 0.5 mg/dL (ref 0.2–1.2)

## 2016-01-31 LAB — LIPID PANEL
CHOL/HDL RATIO: 4
CHOLESTEROL: 228 mg/dL — AB (ref 0–200)
HDL: 64.5 mg/dL (ref >39.00)
LDL CALC: 149 mg/dL — AB (ref 0–99)
NonHDL: 163.01
TRIGLYCERIDES: 69 mg/dL (ref 0.0–149.0)
VLDL: 13.8 mg/dL (ref 0.0–40.0)

## 2016-01-31 LAB — CBC WITH DIFFERENTIAL/PLATELET
Basophils Absolute: 0.1 10*3/uL (ref 0.0–0.1)
Basophils Relative: 0.6 % (ref 0.0–3.0)
EOS PCT: 1.2 % (ref 0.0–5.0)
Eosinophils Absolute: 0.1 10*3/uL (ref 0.0–0.7)
HCT: 35.9 % — ABNORMAL LOW (ref 36.0–46.0)
Hemoglobin: 11.9 g/dL — ABNORMAL LOW (ref 12.0–15.0)
LYMPHS ABS: 3.5 10*3/uL (ref 0.7–4.0)
Lymphocytes Relative: 38 % (ref 12.0–46.0)
MCHC: 33.2 g/dL (ref 30.0–36.0)
MCV: 90.2 fl (ref 78.0–100.0)
MONO ABS: 0.8 10*3/uL (ref 0.1–1.0)
Monocytes Relative: 9.2 % (ref 3.0–12.0)
NEUTROS ABS: 4.7 10*3/uL (ref 1.4–7.7)
Neutrophils Relative %: 51 % (ref 43.0–77.0)
Platelets: 271 10*3/uL (ref 150.0–400.0)
RBC: 3.98 Mil/uL (ref 3.87–5.11)
RDW: 15.2 % (ref 11.5–15.5)
WBC: 9.2 10*3/uL (ref 4.0–10.5)

## 2016-01-31 LAB — BASIC METABOLIC PANEL
BUN: 18 mg/dL (ref 6–23)
CALCIUM: 9.2 mg/dL (ref 8.4–10.5)
CO2: 32 mEq/L (ref 19–32)
Chloride: 99 mEq/L (ref 96–112)
Creatinine, Ser: 0.83 mg/dL (ref 0.40–1.20)
GFR: 69.72 mL/min (ref >60.00)
Glucose, Bld: 95 mg/dL (ref 70–99)
POTASSIUM: 3.7 meq/L (ref 3.5–5.1)
SODIUM: 136 meq/L (ref 135–145)

## 2016-01-31 LAB — HEMOGLOBIN A1C: Hgb A1c MFr Bld: 5.9 % (ref 4.6–6.5)

## 2016-02-02 ENCOUNTER — Encounter: Payer: Self-pay | Admitting: Internal Medicine

## 2016-02-04 ENCOUNTER — Other Ambulatory Visit: Payer: Self-pay | Admitting: *Deleted

## 2016-02-04 ENCOUNTER — Ambulatory Visit (INDEPENDENT_AMBULATORY_CARE_PROVIDER_SITE_OTHER): Payer: Medicare Other | Admitting: Internal Medicine

## 2016-02-04 ENCOUNTER — Encounter: Payer: Self-pay | Admitting: Internal Medicine

## 2016-02-04 VITALS — BP 136/70 | HR 62 | Temp 97.9°F | Resp 12 | Ht 61.25 in | Wt 108.0 lb

## 2016-02-04 DIAGNOSIS — Z0000 Encounter for general adult medical examination without abnormal findings: Secondary | ICD-10-CM

## 2016-02-04 DIAGNOSIS — R739 Hyperglycemia, unspecified: Secondary | ICD-10-CM

## 2016-02-04 DIAGNOSIS — R748 Abnormal levels of other serum enzymes: Secondary | ICD-10-CM

## 2016-02-04 DIAGNOSIS — C73 Malignant neoplasm of thyroid gland: Secondary | ICD-10-CM

## 2016-02-04 DIAGNOSIS — F329 Major depressive disorder, single episode, unspecified: Secondary | ICD-10-CM | POA: Diagnosis not present

## 2016-02-04 DIAGNOSIS — E7800 Pure hypercholesterolemia, unspecified: Secondary | ICD-10-CM

## 2016-02-04 DIAGNOSIS — F32A Depression, unspecified: Secondary | ICD-10-CM

## 2016-02-04 DIAGNOSIS — D649 Anemia, unspecified: Secondary | ICD-10-CM

## 2016-02-04 DIAGNOSIS — H409 Unspecified glaucoma: Secondary | ICD-10-CM

## 2016-02-04 DIAGNOSIS — R634 Abnormal weight loss: Secondary | ICD-10-CM

## 2016-02-04 NOTE — Progress Notes (Signed)
Patient ID: Paige Farmer, female   DOB: 19-Jan-1931, 82 y.o.   MRN: [id number]   Subjective:    Patient ID: Paige Farmer, female    DOB: 1931-04-03, 82 y.o.   MRN: [id number]  HPI  Patient here for her physical exam.  She is accompanied by her son.  History obtained from both of them.  Some weight loss.  She is eating.  Feels depressed.  Is lonely.  Does not feel she needs anything more at this time.  Discussed the need to make sure she is eating regularly. No chest pain.  No sob.  No acid reflux.  No abdominal pain or cramping.  Bowels stable.  Discussed recent lab results.     Past Medical History:  Diagnosis Date  . Cancer ([place])    Thyroid  . Depression   . Glaucoma   . H/O: depression   . History of chicken pox   . Palpitations   . Thyroid cancer ([place])   . Thyroid disease    Past Surgical History:  Procedure Laterality Date  . ABDOMINAL HYSTERECTOMY    . APPENDECTOMY    . LAPAROSCOPIC HYSTERECTOMY  1970   ovaries left in place  . THYROIDECTOMY    . TONSILLECTOMY  1952   Family History  Problem Relation Age of Onset  . Cancer Mother     multiple myeloma  . Parkinson's disease Father   . Cancer - Lung Brother     x 2  . Breast cancer Neg Hx    Social History   Social History  . Marital status: Married    Spouse name: N/A  . Number of children: N/A  . Years of education: N/A   Social History Main Topics  . Smoking status: Never Smoker  . Smokeless tobacco: Never Used  . Alcohol use No  . Drug use: No  . Sexual activity: Not Asked   Other Topics Concern  . None   Social History Narrative  . None    Outpatient Encounter Prescriptions as of 02/04/2016  Medication Sig  . aspirin EC 81 MG tablet Take 81 mg by mouth daily.  Marland Kitchen levothyroxine (SYNTHROID, LEVOTHROID) 75 MCG tablet TAKE 1 TABLET (75 MCG TOTAL) BY MOUTH DAILY.  Marland Kitchen liothyronine (CYTOMEL) 5 MCG tablet TAKE 1.5 TABLETS (7.5 MCG TOTAL) BY MOUTH DAILY.  . Multiple Vitamins-Minerals (CENTRUM) tablet  Take 1 tablet by mouth daily.  . Multiple Vitamins-Minerals (PRESERVISION/LUTEIN) CAPS Take by mouth 2 (two) times daily.  . sertraline (ZOLOFT) 50 MG tablet TAKE 1 AND  1/2 TABLET PER DAY  . timolol (BETIMOL) 0.5 % ophthalmic solution Place 1 drop into both eyes 2 (two) times daily.  . [DISCONTINUED] Omega-3 Fatty Acids (FISH OIL) 1000 MG CAPS Take by mouth.  . [DISCONTINUED] sertraline (ZOLOFT) 50 MG tablet TAKE 1 AND  1/2 TABLET PER DAY  . [DISCONTINUED] sertraline (ZOLOFT) 50 MG tablet TAKE 1 AND  1/2 TABLET PER DAY   No facility-administered encounter medications on file as of 02/04/2016.     Review of Systems  Constitutional:       Some decreased appetite.  Has lost weight.   HENT: Negative for congestion and sinus pressure.   Eyes: Negative for pain and visual disturbance.  Respiratory: Negative for cough, chest tightness and shortness of breath.   Cardiovascular: Negative for chest pain, palpitations and leg swelling.  Gastrointestinal: Negative for abdominal pain, diarrhea, nausea and vomiting.  Genitourinary: Negative for difficulty urinating and dysuria.  Musculoskeletal: Negative  for back pain and joint swelling.  Skin: Negative for color change and rash.  Neurological: Negative for dizziness, light-headedness and headaches.  Hematological: Negative for adenopathy. Does not bruise/bleed easily.  Psychiatric/Behavioral: Negative for agitation and dysphoric mood.       Feels lonely.  Some depression.  Discussed with her today.  Discussed counseling.        Objective:     Blood pressure rechecked by me:  136/70  Physical Exam  Constitutional: She is oriented to person, place, and time. She appears well-developed and well-nourished. No distress.  HENT:  Nose: Nose normal.  Mouth/Throat: Oropharynx is clear and moist.  Eyes: Right eye exhibits no discharge. Left eye exhibits no discharge. No scleral icterus.  Neck: Neck supple. No thyromegaly present.  Cardiovascular:  Normal rate and regular rhythm.   Pulmonary/Chest: Breath sounds normal. No accessory muscle usage. No tachypnea. No respiratory distress. She has no decreased breath sounds. She has no wheezes. She has no rhonchi. Right breast exhibits no inverted nipple, no mass, no nipple discharge and no tenderness (no axillary adenopathy). Left breast exhibits no inverted nipple, no mass, no nipple discharge and no tenderness (no axilarry adenopathy).  Abdominal: Soft. Bowel sounds are normal. There is no tenderness.  Musculoskeletal: She exhibits no edema or tenderness.  Lymphadenopathy:    She has no cervical adenopathy.  Neurological: She is alert and oriented to person, place, and time.  Skin: Skin is warm. No rash noted. No erythema.  Psychiatric: She has a normal mood and affect. Her behavior is normal.    BP 136/70   Pulse 62   Temp 97.9 F (36.6 C)   Resp 12   Ht 5' 1.25" (1.556 m)   Wt 108 lb (49 kg)   BMI 20.24 kg/m  Wt Readings from Last 3 Encounters:  02/06/16 110 lb 3 oz (50 kg)  02/04/16 108 lb (49 kg)  10/03/15 112 lb (50.8 kg)     Lab Results  Component Value Date   WBC 9.2 01/31/2016   HGB 11.9 (L) 01/31/2016   HCT 35.9 (L) 01/31/2016   PLT 271.0 01/31/2016   GLUCOSE 95 01/31/2016   CHOL 228 (H) 01/31/2016   TRIG 69.0 01/31/2016   HDL 64.50 01/31/2016   LDLCALC 149 (H) 01/31/2016   ALT 12 01/31/2016   AST 15 01/31/2016   NA 136 01/31/2016   K 3.7 01/31/2016   CL 99 01/31/2016   CREATININE 0.83 01/31/2016   BUN 18 01/31/2016   CO2 32 01/31/2016   TSH 2.936 01/06/2016   TSH 3.380 01/06/2016   HGBA1C 5.9 01/31/2016    Mm Screening Breast Tomo Bilateral  Result Date: 04/15/2015 CLINICAL DATA:  Screening. EXAM: DIGITAL SCREENING BILATERAL MAMMOGRAM WITH 3D TOMO WITH CAD COMPARISON:  Previous exam(s). ACR Breast Density Category c: The breast tissue is heterogeneously dense, which may obscure small masses. FINDINGS: There are no findings suspicious for malignancy.  Images were processed with CAD. IMPRESSION: No mammographic evidence of malignancy. A result letter of this screening mammogram will be mailed directly to the patient. RECOMMENDATION: Screening mammogram in one year. (Code:SM-B-01Y) BI-RADS CATEGORY  1: Negative. Electronically Signed   By: Altamese Cabal M.D.   On: 04/15/2015 14:08       Assessment & Plan:   Problem List Items Addressed This Visit    Abnormal liver enzymes    Most recent check wnl.        Anemia    Last hgb 11.9.  Follow.  Depression    On zoloft.  Still with some depression.  Husband passed away.  Is lonely.  Discussed counseling.  Desires no further intervention.  Follow.        Glaucoma    Followed by opthalmology.       Health care maintenance    Physical today 02/04/16.  Colonoscopy - states had a few years ago.  Mammogram 04/14/16 - Birads I.       Hypercholesterolemia    LDL 149.  Discussed with her today.  Hold on medication.  Encourage increased po intake.  Follow.       Hyperglycemia    Recent a1c 5.9.  Follow.       Loss of weight    Weight loss as outlined.  Discussed with her and her son today.  Encourage increased po intake.  Follow.        Thyroid cancer [facility])    Sees hematology.  Stable.         Other Visit Diagnoses   None.      Einar Pheasant, MD

## 2016-02-04 NOTE — Progress Notes (Signed)
Pre visit review using our clinic review tool, if applicable. No additional management support is needed unless otherwise documented below in the visit note. 

## 2016-02-06 ENCOUNTER — Inpatient Hospital Stay: Payer: Medicare Other | Attending: Internal Medicine | Admitting: Internal Medicine

## 2016-02-06 ENCOUNTER — Other Ambulatory Visit: Payer: Self-pay

## 2016-02-06 DIAGNOSIS — M8580 Other specified disorders of bone density and structure, unspecified site: Secondary | ICD-10-CM | POA: Diagnosis not present

## 2016-02-06 DIAGNOSIS — C73 Malignant neoplasm of thyroid gland: Secondary | ICD-10-CM

## 2016-02-06 DIAGNOSIS — Z807 Family history of other malignant neoplasms of lymphoid, hematopoietic and related tissues: Secondary | ICD-10-CM

## 2016-02-06 DIAGNOSIS — Z85850 Personal history of malignant neoplasm of thyroid: Secondary | ICD-10-CM | POA: Insufficient documentation

## 2016-02-06 DIAGNOSIS — Z801 Family history of malignant neoplasm of trachea, bronchus and lung: Secondary | ICD-10-CM | POA: Diagnosis not present

## 2016-02-06 DIAGNOSIS — Z79899 Other long term (current) drug therapy: Secondary | ICD-10-CM | POA: Insufficient documentation

## 2016-02-06 DIAGNOSIS — E890 Postprocedural hypothyroidism: Secondary | ICD-10-CM | POA: Diagnosis not present

## 2016-02-06 DIAGNOSIS — Z7982 Long term (current) use of aspirin: Secondary | ICD-10-CM | POA: Diagnosis not present

## 2016-02-06 DIAGNOSIS — R221 Localized swelling, mass and lump, neck: Secondary | ICD-10-CM | POA: Insufficient documentation

## 2016-02-06 NOTE — Progress Notes (Signed)
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 Synthroid 16mg/d + cytomel 5 mc/d; CT Oct 2015- NEG  # AUG 2017 ? 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  More recently patient had been evaluated by ENT for swelling on the right side of the neck; as per patient had ultrasound at the office. For now recommended monitoring. No biopsies for now.   Otherwise patient's appetite is good. No new shortness of breath or chest pain.   REVIEW OF SYSTEMS:  A complete 10 point review of system is done which is negative except mentioned above/history of present illness.   PAST MEDICAL HISTORY :  Past Medical History:  Diagnosis Date  . [place]    Thyroid  . Depression   . Glaucoma   . H/O: depression   . History of chicken pox   . Palpitations   . Thyroid cancer ([place]   . Thyroid disease     PAST SURGICAL HISTORY :   Past Surgical History:  Procedure Laterality Date  . ABDOMINAL HYSTERECTOMY    . APPENDECTOMY    . LAPAROSCOPIC HYSTERECTOMY  1970   ovaries left in place  . THYROIDECTOMY    . TONSILLECTOMY  1952    FAMILY HISTORY :   Family History  Problem Relation Age of Onset  . Cancer Mother     multiple myeloma  . Parkinson's disease Father   . Cancer - Lung Brother     x 2  . Breast cancer Neg Hx     SOCIAL HISTORY:   Social History  Substance Use Topics   . Smoking status: Never Smoker  . Smokeless tobacco: Never Used  . Alcohol use No    ALLERGIES:  is allergic to doxepin.  MEDICATIONS:  Current Outpatient Prescriptions  Medication Sig Dispense Refill  . aspirin EC 81 MG tablet Take 81 mg by mouth daily.    .Marland Kitchenlevothyroxine (SYNTHROID, LEVOTHROID) 75 MCG tablet TAKE 1 TABLET (75 MCG TOTAL) BY MOUTH DAILY. 30 tablet 11  . liothyronine (CYTOMEL) 5 MCG tablet TAKE 1.5 TABLETS (7.5 MCG TOTAL) BY MOUTH DAILY. 45 tablet 10  . Multiple Vitamins-Minerals (CENTRUM) tablet Take 1 tablet by mouth daily.    . Multiple Vitamins-Minerals (PRESERVISION/LUTEIN) CAPS Take by mouth 2 (two) times daily.    . sertraline (ZOLOFT) 50 MG tablet TAKE 1 AND  1/2 TABLET PER DAY 45 tablet 2  . timolol (BETIMOL) 0.5 % ophthalmic solution Place 1 drop into both eyes 2 (two) times daily.     No current facility-administered medications for this visit.     PHYSICAL EXAMINATION: ECOG PERFORMANCE STATUS: 0  BP 113/61 (BP Location: Left Arm, Patient Position: Sitting)   Pulse 74   Temp 98.9 F (37.2 C) (Tympanic)   Resp 18   Wt 110 lb 3 oz (50 kg)   BMI 20.65 kg/m   Filed Weights   02/06/16 1142  Weight: 110 lb 3 oz (50 kg)    GENERAL: Well-nourished well-developed; Alert, no distress and comfortable.  She is alone.  EYES: no pallor or icterus OROPHARYNX: no thrush or ulceration; good dentition  NECK: supple, no masses felt; thyroidectomy scar noted. LYMPH:  no palpable lymphadenopathy in the cervical, axillary or inguinal regions LUNGS: clear to auscultation and  No wheeze or crackles HEART/CVS: regular rate & rhythm and no murmurs; No lower extremity edema ABDOMEN:abdomen soft, non-tender and normal bowel sounds Musculoskeletal:no cyanosis of digits and no clubbing  PSYCH: alert & oriented x 3 with fluent speech NEURO: no focal motor/sensory deficits SKIN:  no rashes or significant lesions  LABORATORY DATA:  I have reviewed the data as listed     Component Value Date/Time   NA 136 01/31/2016 0813   NA 141 08/08/2012 1012   K 3.7 01/31/2016 0813   K 3.8 08/08/2012 1012   CL 99 01/31/2016 0813   CL 104 08/08/2012 1012   CO2 32 01/31/2016 0813   CO2 32 08/08/2012 1012   GLUCOSE 95 01/31/2016 0813   GLUCOSE 93 08/08/2012 1012   BUN 18 01/31/2016 0813   BUN 18 08/08/2012 1012   CREATININE 0.83 01/31/2016 0813   CREATININE 0.85 03/27/2014 1142   CALCIUM 9.2 01/31/2016 0813   CALCIUM 8.9 08/08/2012 1012   PROT 6.6 01/31/2016 0813   PROT 6.5 08/08/2012 1012   ALBUMIN 3.8 01/31/2016 0813   ALBUMIN 3.5 08/08/2012 1012   AST 15 01/31/2016 0813   AST 17 08/08/2012 1012   ALT 12 01/31/2016 0813   ALT 18 08/08/2012 1012   ALKPHOS 58 01/31/2016 0813   ALKPHOS 63 08/08/2012 1012   BILITOT 0.5 01/31/2016 0813   BILITOT 0.4 08/08/2012 1012   GFRNONAA >60 01/06/2016 0758   GFRNONAA >60 03/27/2014 1142   GFRNONAA >60 08/08/2012 1012   GFRAA >60 01/06/2016 0758   GFRAA >60 03/27/2014 1142   GFRAA >60 08/08/2012 1012    No results found for: SPEP, UPEP  Lab Results  Component Value Date   WBC 9.2 01/31/2016   NEUTROABS 4.7 01/31/2016   HGB 11.9 (L) 01/31/2016   HCT 35.9 (L) 01/31/2016   MCV 90.2 01/31/2016   PLT 271.0 01/31/2016      Chemistry      Component Value Date/Time   NA 136 01/31/2016 0813   NA 141 08/08/2012 1012   K 3.7 01/31/2016 0813   K 3.8 08/08/2012 1012   CL 99 01/31/2016 0813   CL 104 08/08/2012 1012   CO2 32 01/31/2016 0813   CO2 32 08/08/2012 1012   BUN 18 01/31/2016 0813   BUN 18 08/08/2012 1012   CREATININE 0.83 01/31/2016 0813   CREATININE 0.85 03/27/2014 1142   GLU 95 06/09/2011      Component Value Date/Time   CALCIUM 9.2 01/31/2016 0813   CALCIUM 8.9 08/08/2012 1012   ALKPHOS 58 01/31/2016 0813   ALKPHOS 63 08/08/2012 1012   AST 15 01/31/2016 0813   AST 17 08/08/2012 1012   ALT 12 01/31/2016 0813   ALT 18 08/08/2012 1012   BILITOT 0.5 01/31/2016 0813   BILITOT 0.4 08/08/2012  1012       ASSESSMENT & PLAN:   Malignant neoplasm of thyroid gland ([place]) [place]- Dr.Vaught.   # Thyroid cancer T2 NX; status post RAI U [2007]- currently on Synthroid 133mg/d + cytomel 5 mc/d . Clinically no evidence of recurrence noted.  Most recent TSH in October 2016- 2.23. I recommend  repeating the TSH today.   # DEXA scan January 2014- osteopenia. Recommend repeat DEXA scan.   # Patient follow-up with me with labs CBC CMP; TSH in 6 months.   #15 minutes face-to-face with the patient/son discussing the above plan of care; more than 50% of time spent on prognosis/ natural history; counseling and coordination.      Cammie Sickle, MD 02/06/2016 6:09 PM

## 2016-02-06 NOTE — Assessment & Plan Note (Addendum)
#   Thyroid cancer T2 NX; status post RAI U [2007]- currently on Synthroid 18mcg/d + cytomel 5 mc/d . Clinically no evidence of recurrence noted. Thyroglobulin- N.   # ? Right neck nodule- eval by Dr.Vaught. Will speak to ENT.   # Patient follow-up with me with labs CBC CMP; TSH/ thyroglobulin in 6 months.

## 2016-02-06 NOTE — Progress Notes (Signed)
Patient states Dr. Nicki Reaper found a nodule in her right neck.  She was referred to Dr Carloyn Manner who told her they would just watch it for the time being unless she wants it biopsied.

## 2016-02-11 ENCOUNTER — Encounter: Payer: Self-pay | Admitting: Internal Medicine

## 2016-02-11 NOTE — Assessment & Plan Note (Signed)
Followed by opthalmology.       

## 2016-02-11 NOTE — Assessment & Plan Note (Signed)
Most recent check wnl.   

## 2016-02-11 NOTE — Assessment & Plan Note (Signed)
Last hgb 11.9.  Follow.   

## 2016-02-11 NOTE — Assessment & Plan Note (Signed)
Recent a1c 5.9.  Follow.

## 2016-02-11 NOTE — Assessment & Plan Note (Signed)
LDL 149.  Discussed with her today.  Hold on medication.  Encourage increased po intake.  Follow.

## 2016-02-11 NOTE — Assessment & Plan Note (Signed)
Sees hematology.  Stable.

## 2016-02-11 NOTE — Assessment & Plan Note (Signed)
On zoloft.  Still with some depression.  Husband passed away.  Is lonely.  Discussed counseling.  Desires no further intervention.  Follow.

## 2016-02-11 NOTE — Assessment & Plan Note (Signed)
Physical today 02/04/16.  Colonoscopy - states had a few years ago.  Mammogram 04/14/16 - Birads I.

## 2016-02-11 NOTE — Assessment & Plan Note (Signed)
Weight loss as outlined.  Discussed with her and her son today.  Encourage increased po intake.  Follow.

## 2016-03-21 ENCOUNTER — Other Ambulatory Visit: Payer: Self-pay | Admitting: Internal Medicine

## 2016-03-21 DIAGNOSIS — R413 Other amnesia: Secondary | ICD-10-CM

## 2016-04-14 ENCOUNTER — Other Ambulatory Visit: Payer: Self-pay | Admitting: Internal Medicine

## 2016-04-15 ENCOUNTER — Encounter: Payer: Self-pay | Admitting: Internal Medicine

## 2016-04-15 ENCOUNTER — Ambulatory Visit (INDEPENDENT_AMBULATORY_CARE_PROVIDER_SITE_OTHER): Payer: Medicare Other | Admitting: Internal Medicine

## 2016-04-15 VITALS — BP 156/76 | HR 61 | Temp 97.8°F | Ht 61.0 in | Wt 110.8 lb

## 2016-04-15 DIAGNOSIS — E7800 Pure hypercholesterolemia, unspecified: Secondary | ICD-10-CM

## 2016-04-15 DIAGNOSIS — R030 Elevated blood-pressure reading, without diagnosis of hypertension: Secondary | ICD-10-CM

## 2016-04-15 DIAGNOSIS — R634 Abnormal weight loss: Secondary | ICD-10-CM

## 2016-04-15 DIAGNOSIS — R413 Other amnesia: Secondary | ICD-10-CM

## 2016-04-15 DIAGNOSIS — Z1231 Encounter for screening mammogram for malignant neoplasm of breast: Secondary | ICD-10-CM | POA: Diagnosis not present

## 2016-04-15 DIAGNOSIS — F329 Major depressive disorder, single episode, unspecified: Secondary | ICD-10-CM

## 2016-04-15 DIAGNOSIS — R739 Hyperglycemia, unspecified: Secondary | ICD-10-CM | POA: Diagnosis not present

## 2016-04-15 DIAGNOSIS — D649 Anemia, unspecified: Secondary | ICD-10-CM

## 2016-04-15 DIAGNOSIS — Z1239 Encounter for other screening for malignant neoplasm of breast: Secondary | ICD-10-CM

## 2016-04-15 DIAGNOSIS — F32A Depression, unspecified: Secondary | ICD-10-CM

## 2016-04-15 DIAGNOSIS — C73 Malignant neoplasm of thyroid gland: Secondary | ICD-10-CM

## 2016-04-15 NOTE — Progress Notes (Addendum)
Patient ID: Paige Farmer, female   DOB: [birth year], 82 y.o.   MRN: [id number]   Subjective:    Patient ID: Paige Farmer, female    DOB: 12/21/1930, 82 y.o.   MRN: [id number]  HPI  Patient here for a scheduled follow up.  She is accompanied by her son.  History obtained from both of them.  She reports she feels she is doing relatively well.  Still adjusting to her husband's death and living alone.  Discussed with her today.  She tries to stay active.  No chest pain.  No sob.  No acid reflux.  No abdominal pain or cramping.  Bowels stable.  Son had reported some memory issues.  Had previously requested referral to neurology.  Already has appt scheduled.     Past Medical History:  Diagnosis Date  . Cancer ([place])    Thyroid  . Depression   . Glaucoma   . H/O: depression   . History of chicken pox   . Palpitations   . Thyroid cancer ([place])   . Thyroid disease    Past Surgical History:  Procedure Laterality Date  . ABDOMINAL HYSTERECTOMY    . APPENDECTOMY    . LAPAROSCOPIC HYSTERECTOMY  1970   ovaries left in place  . THYROIDECTOMY    . TONSILLECTOMY  1952   Family History  Problem Relation Age of Onset  . Cancer Mother     multiple myeloma  . Parkinson's disease Father   . Cancer - Lung Brother     x 2  . Breast cancer Neg Hx    Social History   Social History  . Marital status: Married    Spouse name: N/A  . Number of children: N/A  . Years of education: N/A   Social History Main Topics  . Smoking status: Never Smoker  . Smokeless tobacco: Never Used  . Alcohol use No  . Drug use: No  . Sexual activity: Not Asked   Other Topics Concern  . None   Social History Narrative  . None    Outpatient Encounter Prescriptions as of 04/15/2016  Medication Sig  . aspirin EC 81 MG tablet Take 81 mg by mouth daily.  Marland Kitchen levothyroxine (SYNTHROID, LEVOTHROID) 75 MCG tablet TAKE 1 TABLET (75 MCG TOTAL) BY MOUTH DAILY.  Marland Kitchen liothyronine (CYTOMEL) 5 MCG tablet TAKE 1.5 TABLETS  (7.5 MCG TOTAL) BY MOUTH DAILY.  . Multiple Vitamins-Minerals (CENTRUM) tablet Take 1 tablet by mouth daily.  . Multiple Vitamins-Minerals (PRESERVISION/LUTEIN) CAPS Take by mouth 2 (two) times daily.  . sertraline (ZOLOFT) 50 MG tablet TAKE 1 AND  1/2 TABLET PER DAY  . timolol (BETIMOL) 0.5 % ophthalmic solution Place 1 drop into both eyes 2 (two) times daily.   No facility-administered encounter medications on file as of 04/15/2016.     Review of Systems  Constitutional: Negative for appetite change and unexpected weight change.  HENT: Negative for congestion and sinus pressure.   Respiratory: Negative for cough, chest tightness and shortness of breath.   Cardiovascular: Negative for chest pain, palpitations and leg swelling.  Gastrointestinal: Negative for abdominal pain, diarrhea, nausea and vomiting.  Genitourinary: Negative for difficulty urinating and dysuria.  Musculoskeletal: Negative for back pain and joint swelling.  Skin: Negative for color change and rash.  Neurological: Negative for dizziness, light-headedness and headaches.       Son reports some memory issues.    Psychiatric/Behavioral: Negative for agitation and dysphoric mood.  Objective:     Blood pressure rechecked by me:  148/78  Physical Exam  Constitutional: She appears well-developed and well-nourished. No distress.  HENT:  Nose: Nose normal.  Mouth/Throat: Oropharynx is clear and moist.  Neck: Neck supple. No thyromegaly present.  Cardiovascular: Normal rate and regular rhythm.   Pulmonary/Chest: Breath sounds normal. No respiratory distress. She has no wheezes.  Abdominal: Soft. Bowel sounds are normal. There is no tenderness.  Musculoskeletal: She exhibits no edema or tenderness.  Lymphadenopathy:    She has no cervical adenopathy.  Skin: No rash noted. No erythema.  Psychiatric: She has a normal mood and affect. Her behavior is normal.    BP (!) 156/76   Pulse 61   Temp 97.8 F (36.6 C)  (Oral)   Ht 5\' 1"  (1.549 m)   Wt 110 lb 12.8 oz (50.3 kg)   SpO2 98%   BMI 20.94 kg/m  Wt Readings from Last 3 Encounters:  04/15/16 110 lb 12.8 oz (50.3 kg)  02/06/16 110 lb 3 oz (50 kg)  02/04/16 108 lb (49 kg)     Lab Results  Component Value Date   WBC 9.2 01/31/2016   HGB 11.9 (L) 01/31/2016   HCT 35.9 (L) 01/31/2016   PLT 271.0 01/31/2016   GLUCOSE 95 01/31/2016   CHOL 228 (H) 01/31/2016   TRIG 69.0 01/31/2016   HDL 64.50 01/31/2016   LDLCALC 149 (H) 01/31/2016   ALT 12 01/31/2016   AST 15 01/31/2016   NA 136 01/31/2016   K 3.7 01/31/2016   CL 99 01/31/2016   CREATININE 0.83 01/31/2016   BUN 18 01/31/2016   CO2 32 01/31/2016   TSH 2.936 01/06/2016   TSH 3.380 01/06/2016   HGBA1C 5.9 01/31/2016    Mm Screening Breast Tomo Bilateral  Result Date: 04/15/2015 CLINICAL DATA:  Screening. EXAM: DIGITAL SCREENING BILATERAL MAMMOGRAM WITH 3D TOMO WITH CAD COMPARISON:  Previous exam(s). ACR Breast Density Category c: The breast tissue is heterogeneously dense, which may obscure small masses. FINDINGS: There are no findings suspicious for malignancy. Images were processed with CAD. IMPRESSION: No mammographic evidence of malignancy. A result letter of this screening mammogram will be mailed directly to the patient. RECOMMENDATION: Screening mammogram in one year. (Code:SM-B-01Y) BI-RADS CATEGORY  1: Negative. Electronically Signed   By: 04/17/2015 M.D.   On: 04/15/2015 14:08       Assessment & Plan:   Problem List Items Addressed This Visit    Anemia    hgb just checked by oncology - 11.9.  Follow.       Depression    On zoloft.  Overall stable.  Follow.        Elevated blood pressure reading    Elevated blood pressure today.  Have her spot check her pressure.  Get her back in soon to reassess.        Hypercholesterolemia    Have discussed treatment.  Follow lipid panel.        Hyperglycemia    Follow met b and a1c.       Loss of weight    Weight  stable from previous check.  States eating.  No nausea or vomiting.  Follow.       Memory change    Son reports some memory issues and concerns.  He had previously requested referral to neurology.   Has appt already scheduled.  Follow.        Thyroid cancer ([facility])    Followed by oncology.  Last evaluated 02/06/16.  Stable.        Other Visit Diagnoses    Breast cancer screening    -  Primary   Relevant Orders   MM DIGITAL SCREENING BILATERAL       Einar Pheasant, MD

## 2016-04-15 NOTE — Progress Notes (Signed)
Pre visit review using our clinic review tool, if applicable. No additional management support is needed unless otherwise documented below in the visit note. 

## 2016-04-20 ENCOUNTER — Encounter: Payer: Self-pay | Admitting: Internal Medicine

## 2016-04-20 DIAGNOSIS — R030 Elevated blood-pressure reading, without diagnosis of hypertension: Secondary | ICD-10-CM | POA: Insufficient documentation

## 2016-04-20 DIAGNOSIS — R413 Other amnesia: Secondary | ICD-10-CM | POA: Insufficient documentation

## 2016-04-20 NOTE — Assessment & Plan Note (Signed)
Have discussed treatment.  Follow lipid panel.

## 2016-04-20 NOTE — Assessment & Plan Note (Signed)
Follow met b and a1c.  

## 2016-04-20 NOTE — Assessment & Plan Note (Signed)
Weight stable from previous check.  States eating.  No nausea or vomiting.  Follow.

## 2016-04-20 NOTE — Assessment & Plan Note (Signed)
On zoloft.  Overall stable.  Follow.  

## 2016-04-20 NOTE — Assessment & Plan Note (Signed)
Followed by oncology.   Last evaluated 02/06/16.  Stable.

## 2016-04-20 NOTE — Assessment & Plan Note (Signed)
Son reports some memory issues and concerns.  He had previously requested referral to neurology.   Has appt already scheduled.  Follow.

## 2016-04-20 NOTE — Assessment & Plan Note (Signed)
Elevated blood pressure today.  Have her spot check her pressure.  Get her back in soon to reassess.

## 2016-04-20 NOTE — Assessment & Plan Note (Signed)
hgb just checked by oncology - 11.9.  Follow.

## 2016-04-26 ENCOUNTER — Other Ambulatory Visit: Payer: Self-pay | Admitting: Internal Medicine

## 2016-05-11 ENCOUNTER — Other Ambulatory Visit: Payer: Self-pay | Admitting: Neurology

## 2016-05-12 ENCOUNTER — Other Ambulatory Visit: Payer: Self-pay | Admitting: Neurology

## 2016-05-12 DIAGNOSIS — R413 Other amnesia: Secondary | ICD-10-CM

## 2016-05-13 ENCOUNTER — Telehealth: Payer: Self-pay | Admitting: Internal Medicine

## 2016-05-13 NOTE — Telephone Encounter (Signed)
Spoke with Vaughan Basta sister and states patient was taking old script of Sertraline 50 mg and 100 mg  along with new script.   Advised to take Probiotic.  She has got patients medication organized back to only sertraline 100 mg daily.   She will monitor patient and if diarrhea dosen't improve take her to urgent care.

## 2016-05-13 NOTE — Telephone Encounter (Signed)
If she is having persistent diarrhea and not appetite, she needs to be seen.  Can try a probiotic daily to see if helps, but if this is ongoing, needs evaluation.  We will not be here the rest of the week.  Would recommend going ahead and going for evaluation - to acute care or [facility].

## 2016-05-13 NOTE — Telephone Encounter (Signed)
Pt left a message on office vm. She is requesting to speak to Dr. Nicki Reaper or Janett Billow. She has a medical question. Pt did not state what the question was. Phone number is [phone].

## 2016-05-13 NOTE — Telephone Encounter (Signed)
Spoke with patient states she saw Dr Manuella Ghazi and was  prescribed sertraline 100 mg 1 daily.  Patient states she has been having diarrhea frequency  3-4 day , and can hear stomach rumbling states she has no appetite.   This has be going on since the 14th.  Please advise.

## 2016-05-18 ENCOUNTER — Ambulatory Visit
Admission: RE | Admit: 2016-05-18 | Discharge: 2016-05-18 | Disposition: A | Discharge status: Home / Self Care | Payer: Medicare Other | Source: Ambulatory Visit | Attending: Internal Medicine | Admitting: Internal Medicine

## 2016-05-18 DIAGNOSIS — Z1231 Encounter for screening mammogram for malignant neoplasm of breast: Secondary | ICD-10-CM | POA: Insufficient documentation

## 2016-05-19 ENCOUNTER — Ambulatory Visit
Admission: RE | Admit: 2016-05-19 | Discharge: 2016-05-19 | Disposition: A | Discharge status: Home / Self Care | Payer: Medicare Other | Source: Ambulatory Visit | Attending: Internal Medicine | Admitting: Internal Medicine

## 2016-05-19 DIAGNOSIS — Z1239 Encounter for other screening for malignant neoplasm of breast: Secondary | ICD-10-CM

## 2016-05-21 ENCOUNTER — Telehealth: Payer: Self-pay | Admitting: *Deleted

## 2016-05-21 NOTE — Telephone Encounter (Signed)
Patient has requested a phone consult about her diarrhea x 1 week. Pt has had a medication change, and thought this could be the issue. She has tried a OTC medication, however this medication only helps for a short period .  Pt contact [phone]

## 2016-05-21 NOTE — Telephone Encounter (Signed)
If persistent diarrhea, she needs to be seen - work in appt.

## 2016-05-21 NOTE — Telephone Encounter (Signed)
Patient scheduled for Monday at 830, informed patient if symptoms get worse to go to ER

## 2016-05-21 NOTE — Telephone Encounter (Signed)
Abdominal cramps and diarrhea, no nausea and vomiting. Patient states Dr.Shah changed her zoloft from 1.5tabs to 2 tablets daily on 05/04/16. Symptoms started slightly after this change. Patient has tried culturellle with some relief.

## 2016-05-25 ENCOUNTER — Ambulatory Visit (INDEPENDENT_AMBULATORY_CARE_PROVIDER_SITE_OTHER): Payer: Medicare Other | Admitting: Family

## 2016-05-25 ENCOUNTER — Encounter: Payer: Self-pay | Admitting: Family

## 2016-05-25 ENCOUNTER — Ambulatory Visit
Admission: RE | Admit: 2016-05-25 | Discharge: 2016-05-25 | Disposition: A | Discharge status: Home / Self Care | Payer: Medicare Other | Source: Ambulatory Visit | Attending: Neurology | Admitting: Neurology

## 2016-05-25 VITALS — BP 112/62 | HR 71 | Temp 97.8°F | Ht 61.0 in | Wt 107.6 lb

## 2016-05-25 DIAGNOSIS — R413 Other amnesia: Secondary | ICD-10-CM | POA: Diagnosis not present

## 2016-05-25 DIAGNOSIS — R197 Diarrhea, unspecified: Secondary | ICD-10-CM | POA: Diagnosis not present

## 2016-05-25 DIAGNOSIS — I6782 Cerebral ischemia: Secondary | ICD-10-CM | POA: Diagnosis not present

## 2016-05-25 LAB — COMPREHENSIVE METABOLIC PANEL
ALK PHOS: 67 U/L (ref 39–117)
ALT: 15 U/L (ref 0–35)
AST: 18 U/L (ref 0–37)
Albumin: 4.2 g/dL (ref 3.5–5.2)
BUN: 17 mg/dL (ref 6–23)
CHLORIDE: 99 meq/L (ref 96–112)
CO2: 31 mEq/L (ref 19–32)
Calcium: 9.7 mg/dL (ref 8.4–10.5)
Creatinine, Ser: 0.82 mg/dL (ref 0.40–1.20)
GFR: 70.65 mL/min (ref >60.00)
GLUCOSE: 101 mg/dL — AB (ref 70–99)
POTASSIUM: 4.8 meq/L (ref 3.5–5.1)
SODIUM: 138 meq/L (ref 135–145)
Total Bilirubin: 0.4 mg/dL (ref 0.2–1.2)
Total Protein: 7.1 g/dL (ref 6.0–8.3)

## 2016-05-25 LAB — CBC WITH DIFFERENTIAL/PLATELET
BASOS PCT: 0.5 % (ref 0.0–3.0)
Basophils Absolute: 0 10*3/uL (ref 0.0–0.1)
EOS PCT: 0.9 % (ref 0.0–5.0)
Eosinophils Absolute: 0.1 10*3/uL (ref 0.0–0.7)
HCT: 38.8 % (ref 36.0–46.0)
Hemoglobin: 13 g/dL (ref 12.0–15.0)
LYMPHS ABS: 3 10*3/uL (ref 0.7–4.0)
Lymphocytes Relative: 33 % (ref 12.0–46.0)
MCHC: 33.5 g/dL (ref 30.0–36.0)
MCV: 89.8 fl (ref 78.0–100.0)
MONO ABS: 0.9 10*3/uL (ref 0.1–1.0)
MONOS PCT: 10.4 % (ref 3.0–12.0)
NEUTROS ABS: 5 10*3/uL (ref 1.4–7.7)
NEUTROS PCT: 55.2 % (ref 43.0–77.0)
Platelets: 297 10*3/uL (ref 150.0–400.0)
RBC: 4.32 Mil/uL (ref 3.87–5.11)
RDW: 15.4 % (ref 11.5–15.5)
WBC: 9.1 10*3/uL (ref 4.0–10.5)

## 2016-05-25 LAB — LIPASE: LIPASE: 24 U/L (ref 11.0–59.0)

## 2016-05-25 NOTE — Progress Notes (Signed)
Pre visit review using our clinic review tool, if applicable. No additional management support is needed unless otherwise documented below in the visit note. 

## 2016-05-25 NOTE — Progress Notes (Signed)
Subjective:    Patient ID: Paige Farmer, female    DOB: [birth year], 82 y.o.   MRN: [id number]  CC: ALITA WALDREN is a 82 y.o. female who presents today for an acute visit.    HPI: CC: diarrhea for 3 weeks, unchanged.  Brown watery stool.  No blood. After eating 'anything', has abdhominal cramping and gurgling sounds. 2-3 episodes every day.Doesnt awake ger from sleep/ Drinking lots of water. Describes as generalized crampy pain in abdomen. No severe pain, fever, sinus symptoms.  Has tried Culterelle, yogurt, Imodium ( only 1 day)  without resolve.  Zoloft increased 11/17 which correlated with onset of symptoms. Doesn't suspect undercooked food.      HISTORY:  Past Medical History:  Diagnosis Date  . Cancer ([place])    Thyroid  . Depression   . Glaucoma   . H/O: depression   . History of chicken pox   . Palpitations   . Thyroid cancer ([place])   . Thyroid disease    Past Surgical History:  Procedure Laterality Date  . ABDOMINAL HYSTERECTOMY    . APPENDECTOMY    . LAPAROSCOPIC HYSTERECTOMY  1970   ovaries left in place  . THYROIDECTOMY    . TONSILLECTOMY  1952   Family History  Problem Relation Age of Onset  . Cancer Mother     multiple myeloma  . Parkinson's disease Father   . Cancer - Lung Brother     x 2  . Breast cancer Neg Hx     Allergies: Doxepin Current Outpatient Prescriptions on File Prior to Visit  Medication Sig Dispense Refill  . aspirin EC 81 MG tablet Take 81 mg by mouth daily.    Marland Kitchen levothyroxine (SYNTHROID, LEVOTHROID) 75 MCG tablet TAKE 1 TABLET (75 MCG TOTAL) BY MOUTH DAILY. 30 tablet 11  . liothyronine (CYTOMEL) 5 MCG tablet TAKE 1.5 TABLETS (7.5 MCG TOTAL) BY MOUTH DAILY. 45 tablet 10  . Multiple Vitamins-Minerals (CENTRUM) tablet Take 1 tablet by mouth daily.    . Multiple Vitamins-Minerals (PRESERVISION/LUTEIN) CAPS Take by mouth 2 (two) times daily.    . sertraline (ZOLOFT) 50 MG tablet TAKE 1 AND  1/2 TABLET PER DAY 45 tablet 1  . timolol  (BETIMOL) 0.5 % ophthalmic solution Place 1 drop into both eyes 2 (two) times daily.     No current facility-administered medications on file prior to visit.     Social History  Substance Use Topics  . Smoking status: Never Smoker  . Smokeless tobacco: Never Used  . Alcohol use No    Review of Systems  Constitutional: Negative for chills and fever.  Respiratory: Negative for cough.   Cardiovascular: Negative for chest pain and palpitations.  Gastrointestinal: Positive for diarrhea. Negative for abdominal distention, abdominal pain, blood in stool, constipation, nausea and vomiting.  Genitourinary: Negative for dysuria.  Neurological: Negative for dizziness.      Objective:    BP 112/62   Pulse 71   Temp 97.8 F (36.6 C) (Oral)   Ht '5\' 1"'$  (1.549 m)   Wt 107 lb 9.6 oz (48.8 kg)   SpO2 98%   BMI 20.33 kg/m    Physical Exam  Constitutional: She appears well-developed and well-nourished.  Eyes: Conjunctivae are normal.  Cardiovascular: Normal rate, regular rhythm, normal heart sounds and normal pulses.   Pulmonary/Chest: Effort normal and breath sounds normal. She has no wheezes. She has no rhonchi. She has no rales.  Abdominal: Soft. Normal appearance and bowel sounds are  normal. She exhibits no distension, no fluid wave, no ascites and no mass. There is no tenderness. There is no rigidity, no rebound, no guarding and no CVA tenderness.  Neurological: She is alert.  Skin: Skin is warm and dry.  Psychiatric: She has a normal mood and affect. Her speech is normal and behavior is normal. Thought content normal.  Vitals reviewed.      Assessment & Plan:      I am having Ms. Buckner maintain her aspirin EC, CENTRUM, timolol, PRESERVISION/LUTEIN, levothyroxine, sertraline, and liothyronine.   No orders of the defined types were placed in this encounter.   Return precautions given.   Risks, benefits, and alternatives of the medications and treatment plan prescribed  today were discussed, and patient expressed understanding.   Education regarding symptom management and diagnosis given to patient on AVS.  Continue to follow with Einar Pheasant, MD for routine health maintenance.   Paige Farmer and I agreed with plan.   Mable Paris, FNP

## 2016-05-25 NOTE — Patient Instructions (Signed)
Labs  Stool sample US abdomen tomorrow   If pain worsens, please go to ER.

## 2016-05-25 NOTE — Addendum Note (Signed)
Addended by: Frutoso Chase A on: 05/25/2016 11:24 AM   Modules accepted: Orders

## 2016-05-25 NOTE — Assessment & Plan Note (Signed)
Afebrile. Reassured by benign abdominal exam. Working diagnosis of viral gastroenteritis. Considering increase of zoloft.Pending stool culture, labs, abdominal ultrasound. Return precautions given.

## 2016-05-26 ENCOUNTER — Other Ambulatory Visit: Payer: Self-pay | Admitting: Family

## 2016-05-26 DIAGNOSIS — R197 Diarrhea, unspecified: Secondary | ICD-10-CM

## 2016-05-26 LAB — C. DIFFICILE GDH AND TOXIN A/B
C. DIFF TOXIN A/B: NOT DETECTED
C. DIFFICILE GDH: NOT DETECTED

## 2016-05-27 ENCOUNTER — Ambulatory Visit
Admission: RE | Admit: 2016-05-27 | Discharge: 2016-05-27 | Disposition: A | Discharge status: Home / Self Care | Payer: Medicare Other | Source: Ambulatory Visit | Attending: Family | Admitting: Family

## 2016-05-27 ENCOUNTER — Telehealth: Payer: Self-pay | Admitting: Family

## 2016-05-27 ENCOUNTER — Other Ambulatory Visit: Payer: Self-pay

## 2016-05-27 DIAGNOSIS — R197 Diarrhea, unspecified: Secondary | ICD-10-CM | POA: Diagnosis present

## 2016-05-27 MED ORDER — LIOTHYRONINE SODIUM 5 MCG PO TABS: ORAL_TABLET | ORAL | 1 refills | Status: DC

## 2016-05-27 NOTE — Telephone Encounter (Signed)
Spoke with son regarding lab results, abdominal ultrasound. Verbalized understanding of results. As stated to son, results are all very reassuring. His mother's diarrhea has greatly improved today and 'slightly formed.' He will let me know if it does not continue to resolve and we'll place referral to gastroenterology for further evaluation.

## 2016-05-29 LAB — STOOL CULTURE

## 2016-06-01 LAB — CLOSTRIDIUM DIFFICILE CULTURE-FECAL

## 2016-06-04 ENCOUNTER — Encounter: Payer: Self-pay | Admitting: Internal Medicine

## 2016-06-04 ENCOUNTER — Ambulatory Visit (INDEPENDENT_AMBULATORY_CARE_PROVIDER_SITE_OTHER): Payer: Medicare Other | Admitting: Internal Medicine

## 2016-06-04 DIAGNOSIS — E7800 Pure hypercholesterolemia, unspecified: Secondary | ICD-10-CM

## 2016-06-04 DIAGNOSIS — R413 Other amnesia: Secondary | ICD-10-CM

## 2016-06-04 DIAGNOSIS — R634 Abnormal weight loss: Secondary | ICD-10-CM

## 2016-06-04 DIAGNOSIS — C73 Malignant neoplasm of thyroid gland: Secondary | ICD-10-CM

## 2016-06-04 DIAGNOSIS — D649 Anemia, unspecified: Secondary | ICD-10-CM

## 2016-06-04 DIAGNOSIS — R7989 Other specified abnormal findings of blood chemistry: Secondary | ICD-10-CM

## 2016-06-04 DIAGNOSIS — F32A Depression, unspecified: Secondary | ICD-10-CM

## 2016-06-04 DIAGNOSIS — F329 Major depressive disorder, single episode, unspecified: Secondary | ICD-10-CM

## 2016-06-04 DIAGNOSIS — R945 Abnormal results of liver function studies: Secondary | ICD-10-CM

## 2016-06-04 DIAGNOSIS — R739 Hyperglycemia, unspecified: Secondary | ICD-10-CM

## 2016-06-04 NOTE — Progress Notes (Signed)
Patient ID: Paige Farmer, female   DOB: 1930/09/09, 82 y.o.   MRN: [id number]   Subjective:    Patient ID: Paige Farmer, female    DOB: Jun 16, 1931, 82 y.o.   MRN: [id number]  HPI  Patient here for a scheduled follow up.  Some increased stress recently.  Her husband passed away.  Her son has taken over her finances.  She is planning to move to [facility] or [facility] - independent living.  She is wanting to move, but feels she is stressed because she does not have as much control of things.  Discussed with her today.  She is seeing neurology for memory issues.  See note.  zoloft increased.  Felt depression was contributing.  Recommended mri.  She was recently taking more zoloft than prescribed.  This has been corrected now.  Has someone managing her medications now.  Recently evaluated for diarrhea. Has been better.  No abdominal pain.  Eating.  No nausea or vomiting.  Had normal abdominal ultrasound 05/27/16.     Past Medical History:  Diagnosis Date  . Cancer ([place])    Thyroid  . Depression   . Glaucoma   . H/O: depression   . History of chicken pox   . Palpitations   . Thyroid cancer ([place])   . Thyroid disease    Past Surgical History:  Procedure Laterality Date  . ABDOMINAL HYSTERECTOMY    . APPENDECTOMY    . LAPAROSCOPIC HYSTERECTOMY  1970   ovaries left in place  . THYROIDECTOMY    . TONSILLECTOMY  1952   Family History  Problem Relation Age of Onset  . Cancer Mother     multiple myeloma  . Parkinson's disease Father   . Cancer - Lung Brother     x 2  . Breast cancer Neg Hx    Social History   Social History  . Marital status: Married    Spouse name: N/A  . Number of children: N/A  . Years of education: N/A   Social History Main Topics  . Smoking status: Never Smoker  . Smokeless tobacco: Never Used  . Alcohol use No  . Drug use: No  . Sexual activity: Not Asked   Other Topics Concern  . None   Social History Narrative  . None    Outpatient  Encounter Prescriptions as of 06/04/2016  Medication Sig  . aspirin EC 81 MG tablet Take 81 mg by mouth daily.  Marland Kitchen levothyroxine (SYNTHROID, LEVOTHROID) 75 MCG tablet TAKE 1 TABLET (75 MCG TOTAL) BY MOUTH DAILY.  Marland Kitchen liothyronine (CYTOMEL) 5 MCG tablet TAKE 1.5 TABLETS (7.5 MCG TOTAL) BY MOUTH DAILY.  . Multiple Vitamins-Minerals (CENTRUM) tablet Take 1 tablet by mouth daily.  . Multiple Vitamins-Minerals (PRESERVISION/LUTEIN) CAPS Take by mouth 2 (two) times daily.  . sertraline (ZOLOFT) 50 MG tablet TAKE 1 AND  1/2 TABLET PER DAY  . timolol (BETIMOL) 0.5 % ophthalmic solution Place 1 drop into both eyes 2 (two) times daily.   No facility-administered encounter medications on file as of 06/04/2016.     Review of Systems  Constitutional: Negative for appetite change and unexpected weight change.  HENT: Negative for congestion and sinus pain.   Respiratory: Negative for cough, chest tightness and shortness of breath.   Cardiovascular: Negative for chest pain, palpitations and leg swelling.  Gastrointestinal: Positive for diarrhea. Negative for abdominal pain, nausea and vomiting.       Diarrhea is better.   Genitourinary: Negative for  difficulty urinating and dysuria.  Musculoskeletal: Negative for back pain and joint swelling.  Skin: Negative for color change and rash.  Neurological: Negative for dizziness, light-headedness and headaches.  Psychiatric/Behavioral: Negative for agitation.       Increased stress and some depression as outlined.         Objective:    Physical Exam  Constitutional: She appears well-developed and well-nourished. No distress.  HENT:  Nose: Nose normal.  Mouth/Throat: Oropharynx is clear and moist.  Neck: Neck supple. No thyromegaly present.  Cardiovascular: Normal rate and regular rhythm.   Pulmonary/Chest: Breath sounds normal. No respiratory distress. She has no wheezes.  Abdominal: Soft. Bowel sounds are normal. There is no tenderness.    Musculoskeletal: She exhibits no edema or tenderness.  Lymphadenopathy:    She has no cervical adenopathy.  Skin: No rash noted. No erythema.  Psychiatric: She has a normal mood and affect. Her behavior is normal.    BP 130/80   Pulse 62   Wt 108 lb 12.8 oz (49.4 kg)   SpO2 97%   BMI 20.56 kg/m  Wt Readings from Last 3 Encounters:  06/04/16 108 lb 12.8 oz (49.4 kg)  05/25/16 107 lb 9.6 oz (48.8 kg)  04/15/16 110 lb 12.8 oz (50.3 kg)     Lab Results  Component Value Date   WBC 9.1 05/25/2016   HGB 13.0 05/25/2016   HCT 38.8 05/25/2016   PLT 297.0 05/25/2016   GLUCOSE 101 (H) 05/25/2016   CHOL 228 (H) 01/31/2016   TRIG 69.0 01/31/2016   HDL 64.50 01/31/2016   LDLCALC 149 (H) 01/31/2016   ALT 15 05/25/2016   AST 18 05/25/2016   NA 138 05/25/2016   K 4.8 05/25/2016   CL 99 05/25/2016   CREATININE 0.82 05/25/2016   BUN 17 05/25/2016   CO2 31 05/25/2016   TSH 2.936 01/06/2016   TSH 3.380 01/06/2016   HGBA1C 5.9 01/31/2016    US Abdomen Complete  Result Date: 05/27/2016 CLINICAL DATA:  Diarrhea over the last few weeks. EXAM: ABDOMEN ULTRASOUND COMPLETE COMPARISON:  05/07/2014 FINDINGS: Gallbladder: No gallstones or wall thickening visualized. No sonographic Murphy sign noted by sonographer. Common bile duct: Diameter: 2.5 mm, normal Liver: No focal lesion identified. Within normal limits in parenchymal echogenicity. IVC: No abnormality visualized. Pancreas: Visualized portion unremarkable. Spleen: Size and appearance within normal limits. Right Kidney: Length: 9.1 cm. Echogenicity within normal limits. No mass or hydronephrosis visualized. Left Kidney: Length: 10.1 cm. Echogenicity within normal limits. No mass or hydronephrosis visualized. Abdominal aorta: No aneurysm visualized. Other findings: No ascites IMPRESSION: Normal abdominal ultrasound. Electronically Signed   By: Paulina Fusi M.D.   On: 05/27/2016 09:39       Assessment & Plan:   Problem List Items Addressed  This Visit    Abnormal liver function tests    Checked 05/25/16 - wnl.       Anemia    hgb just checked 05/25/2016 - wnl.       Depression    On zoloft.  Dose just increased.  Follow.       Hyperglycemia    Follow met b and a1c.       Loss of weight    Weight stable from previous check.  Down a couple of pounds from the check prior.  Encourage increased po intake.  Follow.       Memory change    Saw neurology.  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  zoloft increased.  Follow.  Continue f/u with neurology.  Recommended mri.       Pure hypercholesterolemia    Follow lipid panel.       Thyroid cancer ([place])    Followed by oncology.  Last evaluated 01/2016.            Einar Pheasant, MD

## 2016-06-07 NOTE — Assessment & Plan Note (Signed)
Follow lipid panel.   

## 2016-06-07 NOTE — Assessment & Plan Note (Signed)
On zoloft.  Dose just increased.  Follow.

## 2016-06-07 NOTE — Assessment & Plan Note (Addendum)
hgb just checked 05/25/2016 - wnl.

## 2016-06-07 NOTE — Assessment & Plan Note (Signed)
Followed by oncology.  Last evaluated 01/2016.

## 2016-06-07 NOTE — Assessment & Plan Note (Signed)
Weight stable from previous check.  Down a couple of pounds from the check prior.  Encourage increased po intake.  Follow.

## 2016-06-07 NOTE — Assessment & Plan Note (Signed)
Checked 05/25/16 - wnl.

## 2016-06-07 NOTE — Assessment & Plan Note (Signed)
Follow met b and a1c.  

## 2016-06-07 NOTE — Assessment & Plan Note (Signed)
Saw neurology.  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  zoloft increased.  Follow.  Continue f/u with neurology.  Recommended mri.

## 2016-07-15 ENCOUNTER — Telehealth: Payer: Self-pay | Admitting: Internal Medicine

## 2016-07-15 DIAGNOSIS — Z7689 Persons encountering health services in other specified circumstances: Secondary | ICD-10-CM

## 2016-07-15 NOTE — Telephone Encounter (Signed)
Pt's son dropped off FL2 form. Son needs to have this done asap. Caryl Pina will hand to [place]. Call son at [phone] to pick up form.  Thanks

## 2016-07-15 NOTE — Telephone Encounter (Signed)
Form completed and signed.  In box. Thanks

## 2016-07-15 NOTE — Telephone Encounter (Signed)
In your [employer] for  FLMA Patient being placed at [facility] no help needed Bladder /bowel - patient is continentYes Nutrition Patient can Feed herself Ambulatory -Yes  Patient will be placed in basic room

## 2016-07-17 NOTE — Telephone Encounter (Signed)
Son notified ready and picked up

## 2016-07-20 ENCOUNTER — Telehealth: Payer: Self-pay | Admitting: Internal Medicine

## 2016-07-20 NOTE — Telephone Encounter (Signed)
[name] from [facility] called and they need to go ver some medications. Pt moved in today. Please advise, Paige Farmer ou!  Call [name] @ [phone]

## 2016-07-20 NOTE — Telephone Encounter (Signed)
Lm to call office

## 2016-07-20 NOTE — Telephone Encounter (Signed)
I have received the med list from [facility] if you can review and make sure all medications are ok I will update our system and let their office know. I have placed that in the quick sign folder

## 2016-07-20 NOTE — Telephone Encounter (Signed)
Called and spoke with Joellen Jersey there are several medications that we did not have listed she will fax me the list that she has so that we can add the medication that we do not have and have Dr. Nicki Reaper review.

## 2016-07-20 NOTE — Telephone Encounter (Signed)
Dr Nicki Reaper has reviewed meds and has signed form. All meds will be updated in chart. Form faxed to [facility]   Fax: [phone]

## 2016-07-21 ENCOUNTER — Telehealth: Payer: Self-pay

## 2016-07-21 NOTE — Telephone Encounter (Signed)
The thyroid medication needs to be taken on an empty stomach and wait one hour to eat.

## 2016-07-21 NOTE — Telephone Encounter (Signed)
Fax received ( in your quick sign folder) Pt would like to change time that she receives her Levothyroxine from 6 am on empty stomach to 12pm when lunch is served.

## 2016-07-22 ENCOUNTER — Telehealth: Payer: Self-pay | Admitting: *Deleted

## 2016-07-22 NOTE — Telephone Encounter (Signed)
[place] wanted to [place] that physical therapy will be going to the patients home on 07/27/16 to start home health

## 2016-07-23 NOTE — Telephone Encounter (Signed)
Orders have been faxed

## 2016-07-23 NOTE — Telephone Encounter (Signed)
Paige Farmer from [facility] called and left a message following up on some orders that she faxed over on the 1/30 and 1/31. If you have any questions call [phone].

## 2016-07-27 NOTE — Telephone Encounter (Signed)
See message below . Form you previous filled out in your folder.

## 2016-07-27 NOTE — Telephone Encounter (Signed)
Tanzania from [facility] has requested orders to be faxed to office .  Clarity for diclofenac, saline nasal drop, and eye drop has to have specific directions.  Fax [phone] Contact [phone]

## 2016-07-28 NOTE — Telephone Encounter (Signed)
I signed these with changes noted - last week.  Should have been faxed last week.  Let me know if problems.  If they did not receive, see if Joellen has copy that was faxed.  Thanks

## 2016-07-28 NOTE — Telephone Encounter (Signed)
[facility] did not receive fax , please fax this to [phone] .

## 2016-07-29 NOTE — Telephone Encounter (Signed)
Re faxed to office.  

## 2016-08-06 ENCOUNTER — Ambulatory Visit: Payer: Medicare Other | Admitting: Internal Medicine

## 2016-08-06 ENCOUNTER — Other Ambulatory Visit: Payer: Medicare Other

## 2016-08-11 ENCOUNTER — Ambulatory Visit: Payer: Medicare Other | Admitting: Internal Medicine

## 2016-08-19 ENCOUNTER — Ambulatory Visit (INDEPENDENT_AMBULATORY_CARE_PROVIDER_SITE_OTHER): Payer: Medicare Other | Admitting: Internal Medicine

## 2016-08-19 ENCOUNTER — Encounter: Payer: Self-pay | Admitting: Internal Medicine

## 2016-08-19 DIAGNOSIS — F329 Major depressive disorder, single episode, unspecified: Secondary | ICD-10-CM | POA: Diagnosis not present

## 2016-08-19 DIAGNOSIS — R748 Abnormal levels of other serum enzymes: Secondary | ICD-10-CM

## 2016-08-19 DIAGNOSIS — Z760 Encounter for issue of repeat prescription: Secondary | ICD-10-CM

## 2016-08-19 DIAGNOSIS — E7800 Pure hypercholesterolemia, unspecified: Secondary | ICD-10-CM

## 2016-08-19 DIAGNOSIS — R739 Hyperglycemia, unspecified: Secondary | ICD-10-CM

## 2016-08-19 DIAGNOSIS — R413 Other amnesia: Secondary | ICD-10-CM | POA: Diagnosis not present

## 2016-08-19 DIAGNOSIS — C73 Malignant neoplasm of thyroid gland: Secondary | ICD-10-CM | POA: Diagnosis not present

## 2016-08-19 DIAGNOSIS — F32A Depression, unspecified: Secondary | ICD-10-CM

## 2016-08-19 MED ORDER — SERTRALINE HCL 100 MG PO TABS: 100.0000 mg | ORAL_TABLET | Freq: Every day | ORAL | 6 refills | Status: DC

## 2016-08-19 MED ORDER — LIOTHYRONINE SODIUM 5 MCG PO TABS: ORAL_TABLET | ORAL | 11 refills | Status: DC

## 2016-08-19 MED ORDER — LEVOTHYROXINE SODIUM 75 MCG PO TABS: ORAL_TABLET | ORAL | 11 refills | Status: DC

## 2016-08-19 NOTE — Progress Notes (Signed)
Patient ID: Paige Farmer, female   DOB: 1930-08-13, 83 y.o.   MRN: [id number]   Subjective:    Patient ID: Paige Farmer, female    DOB: [birth year], 82 y.o.   MRN: [id number]  HPI  Patient here for a scheduled follow up.  She is accompanied by her son.  History obtained from both of them.  She has moved in to [facility].  This has gone well.  Some adjusting, but overall doing well.  Tries to stay active.  Is eating.  No nausea or vomiting.  Bowels moving.  No chest pain.  Breathing stable.  Saw neurology 08/04/16.  Placed on aricept.  Diagnosed with mixed dementia.  On 153m of zoloft.  buspar added.     Past Medical History:  Diagnosis Date  . Cancer ([facility]    Thyroid  . Depression   . Glaucoma   . H/O: depression   . History of chicken pox   . Palpitations   . [place]   . Thyroid disease    Past Surgical History:  Procedure Laterality Date  . ABDOMINAL HYSTERECTOMY    . APPENDECTOMY    . LAPAROSCOPIC HYSTERECTOMY  1970   ovaries left in place  . THYROIDECTOMY    . TONSILLECTOMY  1952   Family History  Problem Relation Age of Onset  . Cancer Mother     multiple myeloma  . Parkinson's disease Father   . Cancer - Lung Brother     x 2  . Breast cancer Neg Hx    Social History   Social History  . Marital status: Married    Spouse name: N/A  . Number of children: N/A  . Years of education: N/A   Social History Main Topics  . Smoking status: Never Smoker  . Smokeless tobacco: Never Used  . Alcohol use No  . Drug use: No  . Sexual activity: Not Asked   Other Topics Concern  . None   Social History Narrative  . None    Outpatient Encounter Prescriptions as of 08/19/2016  Medication Sig  . aspirin EC 81 MG tablet Take 81 mg by mouth daily.  . busPIRone (BUSPAR) 10 MG tablet Take 10 mg by mouth 3 (three) times daily.  . Diclofenac Sodium 3 % GEL Place onto the skin.  .Marland Kitchendonepezil (ARICEPT) 5 MG tablet 5 mg.   . levothyroxine (SYNTHROID,  LEVOTHROID) 75 MCG tablet TAKE 1 TABLET (75 MCG TOTAL) BY MOUTH DAILY.  .Marland Kitchenliothyronine (CYTOMEL) 5 MCG tablet TAKE 1.5 TABLETS (7.5 MCG TOTAL) BY MOUTH DAILY.  . Melatonin 3 MG CAPS Take by mouth Nightly.  . Multiple Vitamins-Minerals (CENTRUM) tablet Take 1 tablet by mouth daily.  . Multiple Vitamins-Minerals (PRESERVISION/LUTEIN) CAPS Take by mouth 2 (two) times daily.  . timolol (BETIMOL) 0.5 % ophthalmic solution Place 1 drop into both eyes 2 (two) times daily.  . [DISCONTINUED] levothyroxine (SYNTHROID, LEVOTHROID) 75 MCG tablet TAKE 1 TABLET (75 MCG TOTAL) BY MOUTH DAILY.  . [DISCONTINUED] liothyronine (CYTOMEL) 5 MCG tablet TAKE 1.5 TABLETS (7.5 MCG TOTAL) BY MOUTH DAILY.  . [DISCONTINUED] sertraline (ZOLOFT) 50 MG tablet TAKE 1 AND  1/2 TABLET PER DAY (Patient taking differently: 100 mg daily)  . sertraline (ZOLOFT) 100 MG tablet Take 1 tablet (100 mg total) by mouth daily. Please place in blister packs   No facility-administered encounter medications on file as of 08/19/2016.     Review of Systems  Constitutional: Negative for appetite change and  unexpected weight change.  HENT: Negative for congestion and sinus pressure.   Respiratory: Negative for cough, chest tightness and shortness of breath.   Cardiovascular: Negative for chest pain, palpitations and leg swelling.  Gastrointestinal: Negative for abdominal pain, nausea and vomiting.       States only has issues with loose stool when she is stressed.    Genitourinary: Negative for difficulty urinating and dysuria.  Musculoskeletal: Negative for back pain and joint swelling.  Skin: Negative for color change and rash.  Neurological: Negative for dizziness, light-headedness and headaches.  Psychiatric/Behavioral: Negative for agitation and dysphoric mood.       Objective:    Physical Exam  Constitutional: She appears well-developed and well-nourished. No distress.  HENT:  Nose: Nose normal.  Mouth/Throat: Oropharynx is  clear and moist.  Neck: Neck supple. No thyromegaly present.  Cardiovascular: Normal rate and regular rhythm.   Pulmonary/Chest: Breath sounds normal. No respiratory distress. She has no wheezes.  Abdominal: Soft. Bowel sounds are normal. There is no tenderness.  Musculoskeletal: She exhibits no edema or tenderness.  Lymphadenopathy:    She has no cervical adenopathy.  Skin: No rash noted. No erythema.  Psychiatric: She has a normal mood and affect. Her behavior is normal.    BP 140/62 (BP Location: Left Arm, Patient Position: Sitting, Cuff Size: Normal)   Pulse (!) 59   Temp 98 F (36.7 C) (Oral)   Resp 16   Wt 110 lb 6 oz (50.1 kg)   SpO2 98%   BMI 20.86 kg/m  Wt Readings from Last 3 Encounters:  08/19/16 110 lb 6 oz (50.1 kg)  06/04/16 108 lb 12.8 oz (49.4 kg)  05/25/16 107 lb 9.6 oz (48.8 kg)     Lab Results  Component Value Date   WBC 9.1 05/25/2016   HGB 13.0 05/25/2016   HCT 38.8 05/25/2016   PLT 297.0 05/25/2016   GLUCOSE 101 (H) 05/25/2016   CHOL 228 (H) 01/31/2016   TRIG 69.0 01/31/2016   HDL 64.50 01/31/2016   LDLCALC 149 (H) 01/31/2016   ALT 15 05/25/2016   AST 18 05/25/2016   NA 138 05/25/2016   K 4.8 05/25/2016   CL 99 05/25/2016   CREATININE 0.82 05/25/2016   BUN 17 05/25/2016   CO2 31 05/25/2016   TSH 2.936 01/06/2016   TSH 3.380 01/06/2016   HGBA1C 5.9 01/31/2016    US Abdomen Complete  Result Date: 05/27/2016 CLINICAL DATA:  Diarrhea over the last few weeks. EXAM: ABDOMEN ULTRASOUND COMPLETE COMPARISON:  05/07/2014 FINDINGS: Gallbladder: No gallstones or wall thickening visualized. No sonographic Murphy sign noted by sonographer. Common bile duct: Diameter: 2.5 mm, normal Liver: No focal lesion identified. Within normal limits in parenchymal echogenicity. IVC: No abnormality visualized. Pancreas: Visualized portion unremarkable. Spleen: Size and appearance within normal limits. Right Kidney: Length: 9.1 cm. Echogenicity within normal limits. No  mass or hydronephrosis visualized. Left Kidney: Length: 10.1 cm. Echogenicity within normal limits. No mass or hydronephrosis visualized. Abdominal aorta: No aneurysm visualized. Other findings: No ascites IMPRESSION: Normal abdominal ultrasound. Electronically Signed   By: Nelson Chimes M.D.   On: 05/27/2016 09:39       Assessment & Plan:   Problem List Items Addressed This Visit    Abnormal liver enzymes    Follow liver panel.        Depression    On zoloft 143m q day now.  Follow.  On buspar now.  Stable.  [facility] (note -  fax [phone]).        Relevant Medications   sertraline (ZOLOFT) 100 MG tablet   Hypercholesterolemia    Follow lipid pael.  Have discussed treatment options.        Hyperglycemia    Follow met b and a1c.       Memory change    Saw neurology.  Diagnosed with mixed dementia.  Placed on aricept.  Follow.  Continues zoloft.  Taking buspar now.       Thyroid cancer ([place])    Followed by oncology.        Relevant Medications   liothyronine (CYTOMEL) 5 MCG tablet   levothyroxine (SYNTHROID, LEVOTHROID) 75 MCG tablet    Other Visit Diagnoses    Medication refill       Relevant Medications   levothyroxine (SYNTHROID, LEVOTHROID) 75 MCG tablet       Einar Pheasant, MD

## 2016-08-19 NOTE — Progress Notes (Signed)
Pre visit review using our clinic review tool, if applicable. No additional management support is needed unless otherwise documented below in the visit note. 

## 2016-08-23 ENCOUNTER — Encounter: Payer: Self-pay | Admitting: Internal Medicine

## 2016-08-23 NOTE — Assessment & Plan Note (Signed)
Saw neurology.  Diagnosed with mixed dementia.  Placed on aricept.  Follow.  Continues zoloft.  Taking buspar now.

## 2016-08-23 NOTE — Assessment & Plan Note (Addendum)
On zoloft 100mg  q day now.  Follow.  On buspar now.  Stable.  rx [facility] (note - fax [phone]).

## 2016-08-23 NOTE — Assessment & Plan Note (Signed)
Followed by oncology 

## 2016-08-23 NOTE — Assessment & Plan Note (Signed)
Follow liver panel.  

## 2016-08-23 NOTE — Assessment & Plan Note (Signed)
Follow lipid pael.  Have discussed treatment options.

## 2016-08-23 NOTE — Assessment & Plan Note (Signed)
Follow met b and a1c.  

## 2016-08-24 ENCOUNTER — Telehealth: Payer: Self-pay | Admitting: Internal Medicine

## 2016-08-24 NOTE — Telephone Encounter (Signed)
pls advise

## 2016-08-24 NOTE — Telephone Encounter (Signed)
Tanzania [id number] called from [facility] regarding wanting to know if pt can labs done at the facility? If not let her know. Thank you!

## 2016-08-25 NOTE — Telephone Encounter (Signed)
The lab appt that she has scheduled is for Dr Rogue Bussing.  This is scheduled to be done on the same day she sees him.  I would recommend f/u at his office and labs to be done at his office.

## 2016-08-25 NOTE — Telephone Encounter (Signed)
Called Paige Farmer and let her know

## 2016-08-26 ENCOUNTER — Telehealth: Payer: Self-pay | Admitting: *Deleted

## 2016-08-26 NOTE — Telephone Encounter (Signed)
Called back let her know no new orders at this time

## 2016-08-26 NOTE — Telephone Encounter (Signed)
[facility] requested to know if pt had any new orders placed since her last visit  [place] [phone]

## 2016-08-27 ENCOUNTER — Inpatient Hospital Stay: Payer: Medicare Other

## 2016-08-27 ENCOUNTER — Inpatient Hospital Stay: Payer: Medicare Other | Attending: Internal Medicine | Admitting: Internal Medicine

## 2016-08-27 VITALS — BP 167/60 | HR 60 | Temp 96.4°F | Wt 112.2 lb

## 2016-08-27 DIAGNOSIS — G308 Other Alzheimer's disease: Secondary | ICD-10-CM

## 2016-08-27 DIAGNOSIS — Z807 Family history of other malignant neoplasms of lymphoid, hematopoietic and related tissues: Secondary | ICD-10-CM | POA: Diagnosis not present

## 2016-08-27 DIAGNOSIS — H409 Unspecified glaucoma: Secondary | ICD-10-CM | POA: Insufficient documentation

## 2016-08-27 DIAGNOSIS — Z85850 Personal history of malignant neoplasm of thyroid: Secondary | ICD-10-CM | POA: Diagnosis not present

## 2016-08-27 DIAGNOSIS — Z7982 Long term (current) use of aspirin: Secondary | ICD-10-CM | POA: Insufficient documentation

## 2016-08-27 DIAGNOSIS — Z79899 Other long term (current) drug therapy: Secondary | ICD-10-CM | POA: Insufficient documentation

## 2016-08-27 DIAGNOSIS — F329 Major depressive disorder, single episode, unspecified: Secondary | ICD-10-CM

## 2016-08-27 DIAGNOSIS — F0280 Dementia in other diseases classified elsewhere without behavioral disturbance: Secondary | ICD-10-CM | POA: Diagnosis not present

## 2016-08-27 DIAGNOSIS — E890 Postprocedural hypothyroidism: Secondary | ICD-10-CM | POA: Insufficient documentation

## 2016-08-27 DIAGNOSIS — C73 Malignant neoplasm of thyroid gland: Secondary | ICD-10-CM

## 2016-08-27 DIAGNOSIS — Z801 Family history of malignant neoplasm of trachea, bronchus and lung: Secondary | ICD-10-CM | POA: Diagnosis not present

## 2016-08-27 LAB — CBC WITH DIFFERENTIAL/PLATELET
BASOS ABS: 0.1 10*3/uL (ref 0–0.1)
BASOS PCT: 1 %
Eosinophils Absolute: 0.2 10*3/uL (ref 0–0.7)
Eosinophils Relative: 2 %
HEMATOCRIT: 35.1 % (ref 35.0–47.0)
HEMOGLOBIN: 11.9 g/dL — AB (ref 12.0–16.0)
Lymphocytes Relative: 28 %
Lymphs Abs: 2.7 10*3/uL (ref 1.0–3.6)
MCH: 30.6 pg (ref 26.0–34.0)
MCHC: 34 g/dL (ref 32.0–36.0)
MCV: 90.1 fL (ref 80.0–100.0)
Monocytes Absolute: 1 10*3/uL — ABNORMAL HIGH (ref 0.2–0.9)
Monocytes Relative: 11 %
NEUTROS ABS: 5.4 10*3/uL (ref 1.4–6.5)
NEUTROS PCT: 58 %
Platelets: 278 10*3/uL (ref 150–440)
RBC: 3.89 MIL/uL (ref 3.80–5.20)
RDW: 14.7 % — ABNORMAL HIGH (ref 11.5–14.5)
WBC: 9.3 10*3/uL (ref 3.6–11.0)

## 2016-08-27 LAB — COMPREHENSIVE METABOLIC PANEL
ALBUMIN: 3.9 g/dL (ref 3.5–5.0)
ALK PHOS: 69 U/L (ref 38–126)
ALT: 18 U/L (ref 14–54)
AST: 18 U/L (ref 15–41)
Anion gap: 6 (ref 5–15)
BILIRUBIN TOTAL: 0.6 mg/dL (ref 0.3–1.2)
BUN: 19 mg/dL (ref 6–20)
CALCIUM: 9.1 mg/dL (ref 8.9–10.3)
CO2: 32 mmol/L (ref 22–32)
CREATININE: 0.71 mg/dL (ref 0.44–1.00)
Chloride: 97 mmol/L — ABNORMAL LOW (ref 101–111)
GFR calc Af Amer: >60 mL/min (ref >60)
GLUCOSE: 95 mg/dL (ref 65–99)
Potassium: 4.5 mmol/L (ref 3.5–5.1)
Sodium: 135 mmol/L (ref 135–145)
TOTAL PROTEIN: 6.8 g/dL (ref 6.5–8.1)

## 2016-08-27 NOTE — Progress Notes (Signed)
Patient here today for follow up.   

## 2016-08-27 NOTE — Progress Notes (Signed)
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 Synthroid 154mg/d + cytomel 5 mc/d; CT Oct 2015- NEG  # AUG 2017 ? 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   In the interim patient has been diagnosed with a mild to moderate dementia she is currently in assisted living. Accompanied by her son.   Otherwise patient's appetite is good. No new shortness of breath or chest pain. No palpitations no falls. No weight loss.    REVIEW OF SYSTEMS:  A complete 10 point review of system is done which is negative except mentioned above/history of present illness.   PAST MEDICAL HISTORY :  Past Medical History:  Diagnosis Date  . Cancer ([place]    Thyroid  . Depression   . Glaucoma   . H/O: depression   . History of chicken pox   . Palpitations   . Thyroid cancer ([facility]   . Thyroid disease     PAST SURGICAL HISTORY :   Past Surgical History:  Procedure Laterality Date  . ABDOMINAL HYSTERECTOMY    . APPENDECTOMY    . LAPAROSCOPIC HYSTERECTOMY  1970   ovaries left in place  . THYROIDECTOMY    . TONSILLECTOMY  1952    FAMILY HISTORY :   Family History  Problem Relation Age of Onset  . Cancer Mother     multiple myeloma  . Parkinson's disease Father   . Cancer - Lung Brother     x 2  . Breast cancer Neg Hx     SOCIAL HISTORY:   Social  History  Substance Use Topics  . Smoking status: Never Smoker  . Smokeless tobacco: Never Used  . Alcohol use No    ALLERGIES:  is allergic to doxepin.  MEDICATIONS:  Current Outpatient Prescriptions  Medication Sig Dispense Refill  . aspirin EC 81 MG tablet Take 81 mg by mouth daily.    . busPIRone (BUSPAR) 10 MG tablet Take 10 mg by mouth 3 (three) times daily.    . Diclofenac Sodium 3 % GEL Place onto the skin.    .Marland Kitchendonepezil (ARICEPT) 5 MG tablet Take 5 mg by mouth at bedtime.     .Marland Kitchenlevothyroxine (SYNTHROID, LEVOTHROID) 75 MCG tablet TAKE 1 TABLET (75 MCG TOTAL) BY MOUTH DAILY. 30 tablet 11  . liothyronine (CYTOMEL) 5 MCG tablet TAKE 1.5 TABLETS (7.5 MCG TOTAL) BY MOUTH DAILY. 45 tablet 11  . Melatonin 3 MG CAPS Take by mouth Nightly.    . Multiple Vitamins-Minerals (CENTRUM) tablet Take 1 tablet by mouth daily.    . Multiple Vitamins-Minerals (PRESERVISION/LUTEIN) CAPS Take by mouth 2 (two) times daily.    . sertraline (ZOLOFT) 100 MG tablet Take 1 tablet (100 mg total) by mouth daily. Please place in blister packs 30 tablet 6  . timolol (BETIMOL) 0.5 % ophthalmic solution Place 1 drop into both eyes 2 (two)  times daily.     No current facility-administered medications for this visit.     PHYSICAL EXAMINATION: ECOG PERFORMANCE STATUS: 0  BP (!) 167/60 (BP Location: Left Arm, Patient Position: Sitting)   Pulse 60   Temp (!) 96.4 F (35.8 C) (Tympanic)   Wt 112 lb 4 oz (50.9 kg)   BMI 21.21 kg/m   Filed Weights   08/27/16 0919  Weight: 112 lb 4 oz (50.9 kg)    GENERAL: Well-nourished well-developed; Alert, no distress and comfortable.  She is Accompanied by her son.  EYES: no pallor or icterus OROPHARYNX: no thrush or ulceration; good dentition  NECK: supple, no masses felt; thyroidectomy scar noted. LYMPH:  no palpable lymphadenopathy in the cervical, axillary or inguinal regions LUNGS: clear to auscultation and  No wheeze or crackles HEART/CVS: regular rate &  rhythm and no murmurs; No lower extremity edema ABDOMEN:abdomen soft, non-tender and normal bowel sounds Musculoskeletal:no cyanosis of digits and no clubbing  PSYCH: alert & oriented x 3 with fluent speech NEURO: no focal motor/sensory deficits SKIN:  no rashes or significant lesions  LABORATORY DATA:  I have reviewed the data as listed    Component Value Date/Time   NA 135 08/27/2016 0845   NA 141 08/08/2012 1012   K 4.5 08/27/2016 0845   K 3.8 08/08/2012 1012   CL 97 (L) 08/27/2016 0845   CL 104 08/08/2012 1012   CO2 32 08/27/2016 0845   CO2 32 08/08/2012 1012   GLUCOSE 95 08/27/2016 0845   GLUCOSE 93 08/08/2012 1012   BUN 19 08/27/2016 0845   BUN 18 08/08/2012 1012   CREATININE 0.71 08/27/2016 0845   CREATININE 0.85 03/27/2014 1142   CALCIUM 9.1 08/27/2016 0845   CALCIUM 8.9 08/08/2012 1012   PROT 6.8 08/27/2016 0845   PROT 6.5 08/08/2012 1012   ALBUMIN 3.9 08/27/2016 0845   ALBUMIN 3.5 08/08/2012 1012   AST 18 08/27/2016 0845   AST 17 08/08/2012 1012   ALT 18 08/27/2016 0845   ALT 18 08/08/2012 1012   ALKPHOS 69 08/27/2016 0845   ALKPHOS 63 08/08/2012 1012   BILITOT 0.6 08/27/2016 0845   BILITOT 0.4 08/08/2012 1012   GFRNONAA >60 08/27/2016 0845   GFRNONAA >60 03/27/2014 1142   GFRNONAA >60 08/08/2012 1012   GFRAA >60 08/27/2016 0845   GFRAA >60 03/27/2014 1142   GFRAA >60 08/08/2012 1012    No results found for: SPEP, UPEP  Lab Results  Component Value Date   WBC 9.3 08/27/2016   NEUTROABS 5.4 08/27/2016   HGB 11.9 (L) 08/27/2016   HCT 35.1 08/27/2016   MCV 90.1 08/27/2016   PLT 278 08/27/2016      Chemistry      Component Value Date/Time   NA 135 08/27/2016 0845   NA 141 08/08/2012 1012   K 4.5 08/27/2016 0845   K 3.8 08/08/2012 1012   CL 97 (L) 08/27/2016 0845   CL 104 08/08/2012 1012   CO2 32 08/27/2016 0845   CO2 32 08/08/2012 1012   BUN 19 08/27/2016 0845   BUN 18 08/08/2012 1012   CREATININE 0.71 08/27/2016 0845   CREATININE 0.85  03/27/2014 1142   GLU 95 06/09/2011      Component Value Date/Time   CALCIUM 9.1 08/27/2016 0845   CALCIUM 8.9 08/08/2012 1012   ALKPHOS 69 08/27/2016 0845   ALKPHOS 63 08/08/2012 1012   AST 18 08/27/2016 0845   AST 17 08/08/2012 1012   ALT 18 08/27/2016 0845  ALT 18 08/08/2012 1012   BILITOT 0.6 08/27/2016 0845   BILITOT 0.4 08/08/2012 1012       ASSESSMENT & PLAN:   Malignant neoplasm of thyroid gland ([place]) # Thyroid cancer T2 NX; status post RAI U [2007]- currently on Synthroid 187mg/d + cytomel 5 mc/d . Clinically no evidence of recurrence noted. Thyroglobulin- N.   # Dementia- alziehmer/vascular- mild to mod- assisted living. Will not try to be very aggressive with adjusting his Synthroid given her dementia.  # [place]/ thyroglobulin in 6 months.      GCammie Sickle MD 08/27/2016 10:43 AM

## 2016-08-27 NOTE — Assessment & Plan Note (Addendum)
#   Thyroid cancer T2 NX; status post RAI U [2007]- currently on Synthroid 125mcg/d + cytomel 5 mc/d . Clinically no evidence of recurrence noted. Thyroglobulin- N.   # Dementia- alziehmer/vascular- mild to mod- assisted living. Will not try to be very aggressive with adjusting his Synthroid given her dementia.  # Patient follow-up with me with lab [place]; TSH/ thyroglobulin in 6 months.

## 2016-08-28 LAB — TGAB+THYROGLOBULIN IMA OR RIA: Thyroglobulin Antibody: <1 IU/mL (ref 0.0–0.9)

## 2016-08-28 LAB — THYROID PANEL WITH TSH
Free Thyroxine Index: 1.3 (ref 1.2–4.9)
T3 UPTAKE RATIO: 24 % (ref 24–39)
T4, Total: 5.3 ug/dL (ref 4.5–12.0)
TSH: 3.38 u[IU]/mL (ref 0.450–4.500)

## 2016-08-28 LAB — THYROGLOBULIN BY IMA: Thyroglobulin by IMA: <0.1 ng/mL — ABNORMAL LOW (ref 1.5–38.5)

## 2016-09-08 ENCOUNTER — Telehealth: Payer: Self-pay | Admitting: Internal Medicine

## 2016-09-08 NOTE — Telephone Encounter (Signed)
Ok to change to prn

## 2016-09-08 NOTE — Telephone Encounter (Signed)
Attempted to reach angela at the facility, left a VM to return my call, thanks

## 2016-09-08 NOTE — Telephone Encounter (Signed)
Angella called from [facility] living need verbal order for pt's imodium to be prn. They need this ASAP. Please advise, thank you!  Call [facility] @ [phone]

## 2016-09-08 NOTE — Telephone Encounter (Signed)
Facility called a second time for a verbal order,  Chart states that you had done stool cultures prior in December for possible Cdiff (diagnosis acute diarrhea), does she need to be seen?

## 2016-09-09 NOTE — Telephone Encounter (Signed)
Attempted to reach angela again, left VM. thanks

## 2016-09-11 ENCOUNTER — Telehealth: Payer: Self-pay | Admitting: Internal Medicine

## 2016-09-11 NOTE — Telephone Encounter (Signed)
Paige Farmer from [facility] called and is looking for a verbal order for levothyroxine (SYNTHROID, LEVOTHROID) 75 MCG tablet. Please advise, thank you!  Call [facility] @ [phone]

## 2016-09-11 NOTE — Telephone Encounter (Signed)
Called back order given

## 2016-09-11 NOTE — Telephone Encounter (Signed)
Called l/m to call back  

## 2016-10-07 ENCOUNTER — Telehealth: Payer: Self-pay | Admitting: Internal Medicine

## 2016-10-07 NOTE — Telephone Encounter (Signed)
Pt son declined AWV. Pt is in assisted living now due to her dementia.

## 2017-01-02 ENCOUNTER — Other Ambulatory Visit: Payer: Self-pay | Admitting: Family Medicine

## 2017-01-05 ENCOUNTER — Other Ambulatory Visit: Payer: Self-pay | Admitting: Internal Medicine

## 2017-02-18 ENCOUNTER — Encounter: Payer: Self-pay | Admitting: Internal Medicine

## 2017-02-19 ENCOUNTER — Ambulatory Visit: Payer: Medicare Other | Admitting: Internal Medicine

## 2017-02-26 ENCOUNTER — Telehealth: Payer: Self-pay

## 2017-02-26 LAB — BASIC METABOLIC PANEL
BUN: 15 (ref 4–21)
Creatinine: 0.7 (ref 0.5–1.1)
Glucose: 99
POTASSIUM: 4.2 (ref 3.4–5.3)
Sodium: 139 (ref 137–147)

## 2017-02-26 LAB — CBC AND DIFFERENTIAL
HCT: 35 — AB (ref 36–46)
HEMOGLOBIN: 11.2 — AB (ref 12.0–16.0)
Platelets: 237 (ref 150–399)
WBC: 7.4

## 2017-02-26 LAB — HEPATIC FUNCTION PANEL
ALT: 12 (ref 7–35)
AST: 15 (ref 13–35)

## 2017-02-26 NOTE — Telephone Encounter (Signed)
Labs received placed in your folder for review. Will need to abstract after review.

## 2017-03-01 NOTE — Telephone Encounter (Signed)
Notify pts son, I reviewed labs.  Labs ok except for slightly decreased hgb.  We will follow.  In reviewing, it appears that she has a f/u lab appt and appt with Dr Rogue Bussing.  He is rechecking her cbc.  Labs placed back in box.

## 2017-03-01 NOTE — Telephone Encounter (Signed)
Called son and gave results. No questions at this time. Labs abstracted.

## 2017-03-02 ENCOUNTER — Inpatient Hospital Stay: Payer: Medicare Other

## 2017-03-02 ENCOUNTER — Inpatient Hospital Stay: Payer: Medicare Other | Attending: Internal Medicine | Admitting: Internal Medicine

## 2017-03-02 VITALS — BP 127/80 | HR 82 | Temp 97.8°F | Resp 20 | Ht 61.0 in | Wt 117.7 lb

## 2017-03-02 DIAGNOSIS — Z7982 Long term (current) use of aspirin: Secondary | ICD-10-CM | POA: Insufficient documentation

## 2017-03-02 DIAGNOSIS — Z85850 Personal history of malignant neoplasm of thyroid: Secondary | ICD-10-CM | POA: Insufficient documentation

## 2017-03-02 DIAGNOSIS — H409 Unspecified glaucoma: Secondary | ICD-10-CM

## 2017-03-02 DIAGNOSIS — R600 Localized edema: Secondary | ICD-10-CM | POA: Insufficient documentation

## 2017-03-02 DIAGNOSIS — F0390 Unspecified dementia without behavioral disturbance: Secondary | ICD-10-CM | POA: Insufficient documentation

## 2017-03-02 DIAGNOSIS — R221 Localized swelling, mass and lump, neck: Secondary | ICD-10-CM

## 2017-03-02 DIAGNOSIS — C73 Malignant neoplasm of thyroid gland: Secondary | ICD-10-CM

## 2017-03-02 DIAGNOSIS — Z807 Family history of other malignant neoplasms of lymphoid, hematopoietic and related tissues: Secondary | ICD-10-CM | POA: Insufficient documentation

## 2017-03-02 DIAGNOSIS — Z79899 Other long term (current) drug therapy: Secondary | ICD-10-CM | POA: Insufficient documentation

## 2017-03-02 DIAGNOSIS — F329 Major depressive disorder, single episode, unspecified: Secondary | ICD-10-CM | POA: Diagnosis not present

## 2017-03-02 DIAGNOSIS — E890 Postprocedural hypothyroidism: Secondary | ICD-10-CM

## 2017-03-02 LAB — CBC WITH DIFFERENTIAL/PLATELET
Basophils Absolute: 0.1 10*3/uL (ref 0–0.1)
Basophils Relative: 1 %
EOS ABS: 0.1 10*3/uL (ref 0–0.7)
EOS PCT: 1 %
HCT: 35.5 % (ref 35.0–47.0)
HEMOGLOBIN: 12 g/dL (ref 12.0–16.0)
LYMPHS PCT: 29 %
Lymphs Abs: 2.5 10*3/uL (ref 1.0–3.6)
MCH: 30.9 pg (ref 26.0–34.0)
MCHC: 34 g/dL (ref 32.0–36.0)
MCV: 90.9 fL (ref 80.0–100.0)
MONOS PCT: 9 %
Monocytes Absolute: 0.8 10*3/uL (ref 0.2–0.9)
NEUTROS PCT: 60 %
Neutro Abs: 5.2 10*3/uL (ref 1.4–6.5)
Platelets: 248 10*3/uL (ref 150–440)
RBC: 3.9 MIL/uL (ref 3.80–5.20)
RDW: 14.8 % — ABNORMAL HIGH (ref 11.5–14.5)
WBC: 8.6 10*3/uL (ref 3.6–11.0)

## 2017-03-02 LAB — COMPREHENSIVE METABOLIC PANEL
ALK PHOS: 57 U/L (ref 38–126)
ALT: 16 U/L (ref 14–54)
ANION GAP: 6 (ref 5–15)
AST: 22 U/L (ref 15–41)
Albumin: 3.8 g/dL (ref 3.5–5.0)
BUN: 16 mg/dL (ref 6–20)
CALCIUM: 8.8 mg/dL — AB (ref 8.9–10.3)
CO2: 30 mmol/L (ref 22–32)
CREATININE: 0.81 mg/dL (ref 0.44–1.00)
Chloride: 99 mmol/L — ABNORMAL LOW (ref 101–111)
Glucose, Bld: 82 mg/dL (ref 65–99)
Potassium: 3.8 mmol/L (ref 3.5–5.1)
SODIUM: 135 mmol/L (ref 135–145)
TOTAL PROTEIN: 6.6 g/dL (ref 6.5–8.1)
Total Bilirubin: 0.8 mg/dL (ref 0.3–1.2)

## 2017-03-02 NOTE — Progress Notes (Signed)
[place] OFFICE PROGRESS NOTE  Patient Care Team: Einar Pheasant, MD as PCP - General (Internal Medicine)   SUMMARY OF ONCOLOGIC HISTORY:  Oncology History   # NOV 3662- FOLLICULAR CARCINOMA [[id number] thyroid lobe T=4cm; ] s/p [partial thyroidectomy; s/p complete thyroidectomy dec 2007; dr.Vaught] s/p RAIU. Synthroid 191mg/d + cytomel 5 mc/d; CT Oct 2015- NEG  # AUG 2017 ? 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   Patient continues to live in assisted living; she is accompanied by son.  Patient complains of bilateral leg swelling dependent. She has been asked to wear stockings however she finds them cumbersome. She continues to have a small lump on the right neck- not getting any worse. He had followed up with Dr. VPryor Ochoa   Otherwise patient's appetite is good. No new shortness of breath or chest pain. No palpitations no falls. No weight loss.    REVIEW OF SYSTEMS:  A complete 10 point review of system is done which is negative except mentioned above/history of present illness.   PAST MEDICAL HISTORY :  Past Medical History:  Diagnosis Date  . Cancer ([place]    Thyroid  . Depression   . Glaucoma   . H/O: depression   . History of chicken pox   . Palpitations   . Thyroid cancer ([place]   . Thyroid disease     PAST SURGICAL HISTORY :   Past Surgical History:  Procedure Laterality Date  . ABDOMINAL HYSTERECTOMY    . APPENDECTOMY    . LAPAROSCOPIC HYSTERECTOMY  1970   ovaries left in place  . THYROIDECTOMY    . TONSILLECTOMY  1952    FAMILY HISTORY :   Family History  Problem Relation Age of Onset  . Cancer Mother        multiple myeloma  . Parkinson's  disease Father   . Cancer - Lung Brother        x 2  . Breast cancer Neg Hx     SOCIAL HISTORY:   Social History  Substance Use Topics  . Smoking status: Never Smoker  . Smokeless tobacco: Never Used  . Alcohol use No    ALLERGIES:  is allergic to doxepin.  MEDICATIONS:  Current Outpatient Prescriptions  Medication Sig Dispense Refill  . aspirin EC 81 MG tablet Take 81 mg by mouth daily.    . busPIRone (BUSPAR) 10 MG tablet Take 10 mg by mouth 3 (three) times daily.    . Diclofenac Sodium 3 % GEL APPLY TOPICALLY TO FACIAL SPOTS ONCE DAILY 100 g PRN  . donepezil (ARICEPT) 5 MG tablet Take 5 mg by mouth at bedtime.     . dorzolamide-timolol (COSOPT) 22.3-6.8 MG/ML ophthalmic solution INSTILL 1 DROP INTO BOTH EYES TWICE DAILY 10 mL 0  . levothyroxine (SYNTHROID, LEVOTHROID) 75 MCG tablet TAKE 1 TABLET (75 MCG TOTAL) BY MOUTH DAILY. 30 tablet 11  . liothyronine (CYTOMEL) 5 MCG tablet TAKE 1.5 TABLETS (7.5 MCG TOTAL) BY MOUTH DAILY. 45 tablet 11  . Melatonin 3 MG CAPS Take by mouth Nightly.    . Multiple Vitamins-Minerals (CENTRUM) tablet Take 1 tablet by mouth daily.    . Multiple Vitamins-Minerals (PRESERVISION/LUTEIN) CAPS Take by mouth 2 (  two) times daily.    . sertraline (ZOLOFT) 100 MG tablet Take 1 tablet (100 mg total) by mouth daily. Please place in blister packs 30 tablet 6  . timolol (BETIMOL) 0.5 % ophthalmic solution Place 1 drop into both eyes 2 (two) times daily.     No current facility-administered medications for this visit.     PHYSICAL EXAMINATION: ECOG PERFORMANCE STATUS: 0  BP 127/80 (BP Location: Left Arm, Patient Position: Sitting)   Pulse 82   Temp 97.8 F (36.6 C) (Tympanic)   Resp 20   Ht '5\' 1"'$  (1.549 m)   Wt 117 lb 11.6 oz (53.4 kg)   BMI 22.24 kg/m   Filed Weights   03/02/17 0940  Weight: 117 lb 11.6 oz (53.4 kg)    GENERAL: Well-nourished well-developed; Alert, no distress and comfortable.  She is Accompanied by her son.  EYES: no pallor  or icterus OROPHARYNX: no thrush or ulceration; good dentition  NECK: supple,  thyroidectomy scar noted.Question 1 cm nodule in the right neck LYMPH:  no palpable lymphadenopathy in the cervical, axillary or inguinal regions LUNGS: clear to auscultation and  No wheeze or crackles HEART/CVS: regular rate & rhythm and no murmurs; No lower extremity edema ABDOMEN:abdomen soft, non-tender and normal bowel sounds Musculoskeletal:no cyanosis of digits and no clubbing  PSYCH: alert & oriented x 3 with fluent speech NEURO: no focal motor/sensory deficits SKIN:  no rashes or significant lesions  LABORATORY DATA:  I have reviewed the data as listed    Component Value Date/Time   NA 135 03/02/2017 0909   NA 139 02/26/2017   NA 141 08/08/2012 1012   K 3.8 03/02/2017 0909   K 3.8 08/08/2012 1012   CL 99 (L) 03/02/2017 0909   CL 104 08/08/2012 1012   CO2 30 03/02/2017 0909   CO2 32 08/08/2012 1012   GLUCOSE 82 03/02/2017 0909   GLUCOSE 93 08/08/2012 1012   BUN 16 03/02/2017 0909   BUN 15 02/26/2017   BUN 18 08/08/2012 1012   CREATININE 0.81 03/02/2017 0909   CREATININE 0.85 03/27/2014 1142   CALCIUM 8.8 (L) 03/02/2017 0909   CALCIUM 8.9 08/08/2012 1012   PROT 6.6 03/02/2017 0909   PROT 6.5 08/08/2012 1012   ALBUMIN 3.8 03/02/2017 0909   ALBUMIN 3.5 08/08/2012 1012   AST 22 03/02/2017 0909   AST 17 08/08/2012 1012   ALT 16 03/02/2017 0909   ALT 18 08/08/2012 1012   ALKPHOS 57 03/02/2017 0909   ALKPHOS 63 08/08/2012 1012   BILITOT 0.8 03/02/2017 0909   BILITOT 0.4 08/08/2012 1012   GFRNONAA >60 03/02/2017 0909   GFRNONAA >60 03/27/2014 1142   GFRNONAA >60 08/08/2012 1012   GFRAA >60 03/02/2017 0909   GFRAA >60 03/27/2014 1142   GFRAA >60 08/08/2012 1012    No results found for: SPEP, UPEP  Lab Results  Component Value Date   WBC 8.6 03/02/2017   NEUTROABS 5.2 03/02/2017   HGB 12.0 03/02/2017   HCT 35.5 03/02/2017   MCV 90.9 03/02/2017   PLT 248 03/02/2017       Chemistry      Component Value Date/Time   NA 135 03/02/2017 0909   NA 139 02/26/2017   NA 141 08/08/2012 1012   K 3.8 03/02/2017 0909   K 3.8 08/08/2012 1012   CL 99 (L) 03/02/2017 0909   CL 104 08/08/2012 1012   CO2 30 03/02/2017 0909   CO2 32 08/08/2012 1012   BUN 16 03/02/2017  0909   BUN 15 02/26/2017   BUN 18 08/08/2012 1012   CREATININE 0.81 03/02/2017 0909   CREATININE 0.85 03/27/2014 1142   GLU 99 02/26/2017      Component Value Date/Time   CALCIUM 8.8 (L) 03/02/2017 0909   CALCIUM 8.9 08/08/2012 1012   ALKPHOS 57 03/02/2017 0909   ALKPHOS 63 08/08/2012 1012   AST 22 03/02/2017 0909   AST 17 08/08/2012 1012   ALT 16 03/02/2017 0909   ALT 18 08/08/2012 1012   BILITOT 0.8 03/02/2017 0909   BILITOT 0.4 08/08/2012 1012       ASSESSMENT & PLAN:   Malignant neoplasm of thyroid gland ([place]) # Thyroid cancer T2 NX; status post RAI U [2007]- currently on Synthroid 157mg/d + cytomel 5 mc/d . Clinically no evidence of recurrence noted. February 2018 Thyroglobulin- Neg; labs from today pending.  # Right submandibular node  # Dementia- alziehmer/vascular- mild to mod- assisted living. Currently stable.  Will not try to be very aggressive with adjusting his Synthroid given her dementia.  # Bil LE swelling- stocking/if worse deferred to PCP regarding diuretics.  # Patient follow-[place] with me with lab [facility] profile/ thyroglobulin in 6 months.      GCammie Sickle MD 03/02/2017 12:54 PM

## 2017-03-02 NOTE — Assessment & Plan Note (Addendum)
#   Thyroid cancer T2 NX; status post RAI U [2007]- currently on Synthroid 139mcg/d + cytomel 5 mc/d . Clinically no evidence of recurrence noted. February 2018 Thyroglobulin- Neg; labs from today pending.  # Right submandibular node  # Dementia- alziehmer/vascular- mild to mod- assisted living. Currently stable.  Will not try to be very aggressive with adjusting his Synthroid given her dementia.  # Bil LE swelling- stocking/if worse deferred to PCP regarding diuretics.  # Patient follow-up with me with lab at [facility]; thyroid profile/ thyroglobulin in 6 months. We will call son with results.

## 2017-03-03 LAB — THYROGLOBULIN BY IMA: Thyroglobulin by IMA: <0.1 ng/mL — ABNORMAL LOW (ref 1.5–38.5)

## 2017-03-03 LAB — TGAB+THYROGLOBULIN IMA OR RIA: Thyroglobulin Antibody: <1 IU/mL (ref 0.0–0.9)

## 2017-03-03 LAB — THYROID PANEL WITH TSH
FREE THYROXINE INDEX: 1.3 (ref 1.2–4.9)
T3 Uptake Ratio: 23 % — ABNORMAL LOW (ref 24–39)
T4, Total: 5.7 ug/dL (ref 4.5–12.0)
TSH: 1.59 u[IU]/mL (ref 0.450–4.500)

## 2017-03-03 NOTE — Progress Notes (Signed)
Please inform son that labs/tumor markers- normal.

## 2017-03-16 ENCOUNTER — Telehealth: Payer: Self-pay | Admitting: Internal Medicine

## 2017-03-16 NOTE — Telephone Encounter (Signed)
Verbal given to Chanel at [facility]

## 2017-03-16 NOTE — Telephone Encounter (Signed)
Spoke with Chanel from [facility]. Patient complaining of her eyes being irritated. There is no drainage. Patient is not running a fever. Says her eyes feel dry. Please advise.

## 2017-03-16 NOTE — Telephone Encounter (Signed)
If dry and not redness and drainage, would recommend artificial tears - 2-3 drops bid x 1 week.  Let us know if persistent problems.

## 2017-03-16 NOTE — Telephone Encounter (Signed)
Left message for nurse to call back.  

## 2017-03-16 NOTE — Telephone Encounter (Signed)
Chanel called from [facility] living regarding Paige Farmer stating the patient's eye has been irritated for a couple of days . Please advise.

## 2017-04-13 ENCOUNTER — Telehealth: Payer: Self-pay | Admitting: Internal Medicine

## 2017-04-13 NOTE — Telephone Encounter (Signed)
Levada Dy from [facility] called to see if we can get Dr. Nicki Reaper to sign orders for pt's eye drops. She said that they faxed several times already but was resending it again. Please advise, thank you!  Call Angela @ [phone]

## 2017-04-13 NOTE — Telephone Encounter (Signed)
Per note from Dr. Nicki Reaper I have called and let them know that eye doctor needs to give order. She will call them and get that and let us know if we can help.

## 2017-04-22 ENCOUNTER — Emergency Department: Payer: Medicare Other

## 2017-04-22 ENCOUNTER — Encounter: Payer: Self-pay | Admitting: Emergency Medicine

## 2017-04-22 ENCOUNTER — Emergency Department
Admission: EM | Admit: 2017-04-22 | Discharge: 2017-04-22 | Disposition: A | Discharge status: Home / Self Care | Payer: Medicare Other | Attending: Emergency Medicine | Admitting: Emergency Medicine

## 2017-04-22 DIAGNOSIS — W01198A Fall on same level from slipping, tripping and stumbling with subsequent striking against other object, initial encounter: Secondary | ICD-10-CM | POA: Diagnosis not present

## 2017-04-22 DIAGNOSIS — Y9389 Activity, other specified: Secondary | ICD-10-CM | POA: Insufficient documentation

## 2017-04-22 DIAGNOSIS — S0990XA Unspecified injury of head, initial encounter: Secondary | ICD-10-CM | POA: Diagnosis present

## 2017-04-22 DIAGNOSIS — F329 Major depressive disorder, single episode, unspecified: Secondary | ICD-10-CM | POA: Insufficient documentation

## 2017-04-22 DIAGNOSIS — Z7982 Long term (current) use of aspirin: Secondary | ICD-10-CM | POA: Insufficient documentation

## 2017-04-22 DIAGNOSIS — Y998 Other external cause status: Secondary | ICD-10-CM | POA: Insufficient documentation

## 2017-04-22 DIAGNOSIS — Z79899 Other long term (current) drug therapy: Secondary | ICD-10-CM | POA: Diagnosis not present

## 2017-04-22 DIAGNOSIS — Y92129 Unspecified place in nursing home as the place of occurrence of the external cause: Secondary | ICD-10-CM | POA: Insufficient documentation

## 2017-04-22 DIAGNOSIS — S61411A Laceration without foreign body of right hand, initial encounter: Secondary | ICD-10-CM | POA: Diagnosis not present

## 2017-04-22 DIAGNOSIS — S0083XA Contusion of other part of head, initial encounter: Secondary | ICD-10-CM | POA: Insufficient documentation

## 2017-04-22 DIAGNOSIS — S62655A Nondisplaced fracture of medial phalanx of left ring finger, initial encounter for closed fracture: Secondary | ICD-10-CM | POA: Insufficient documentation

## 2017-04-22 DIAGNOSIS — Z85850 Personal history of malignant neoplasm of thyroid: Secondary | ICD-10-CM | POA: Insufficient documentation

## 2017-04-22 DIAGNOSIS — W19XXXA Unspecified fall, initial encounter: Secondary | ICD-10-CM

## 2017-04-22 DIAGNOSIS — S0181XA Laceration without foreign body of other part of head, initial encounter: Secondary | ICD-10-CM | POA: Diagnosis not present

## 2017-04-22 DIAGNOSIS — I10 Essential (primary) hypertension: Secondary | ICD-10-CM | POA: Diagnosis not present

## 2017-04-22 MED ORDER — ACETAMINOPHEN 500 MG PO TABS
1000.0000 mg | ORAL_TABLET | Freq: Once | ORAL | Status: AC
  Administered 2017-04-22: 1000 mg via ORAL
  Filled 2017-04-22: qty 2

## 2017-04-22 NOTE — ED Notes (Signed)
Patient transported to X-ray 

## 2017-04-22 NOTE — ED Triage Notes (Signed)
Patient from [facility] via [place]. Reports she was assisting another resident to the restroom when the other resident fell, pulling her down. Patient states she hit the cement wall with her head. Denies LOC. Laceration and hematoma noted to left side of face. Bleeding controlled. Small lacerations noted to right hand. Patient A&O x4. Patient denies use of blood thinners. Takes daily aspirin.

## 2017-04-22 NOTE — ED Notes (Signed)
Finger splint to left ring finger.  dermabond applied by md to cuts on face and right hand.

## 2017-04-22 NOTE — ED Notes (Signed)
Resumed care from stephanie rn.  Pt alert.  md at bedside.  Family with pt.

## 2017-04-22 NOTE — ED Notes (Signed)
Pt states she was assisting a friend and the friend lost there balance and pulled her down onto the concrete. Pt has eccysmosis with abrasions to the left cheek/brow. Pt has abrasions to the right hand.

## 2017-04-22 NOTE — ED Provider Notes (Signed)
[facility] Emergency Department Provider Note  ____________________________________________  Time seen: Approximately 3:56 PM  I have reviewed the triage vital signs and the nursing notes.   HISTORY  Chief Complaint Fall   HPI Paige Farmer is a 83 y.o. female who presents for evaluation of a mechanical fall. Patient was walking with a friend. Her friend lost balance and patient was trying to prevent her plan from falling and they both ended up on the ground. Patient reports that she hit her head onto a cement wall. No LOC. She he is on aspirin but no other blood thinners. Patient is complaining of moderate constant sharp pain located in the left forehead where she hit. She is also complaining of pain in her right thumb. She sustained a small laceration to the tip of the thumb. Tetanus shots up-to-date. She denies changes in vision, neck pain, back pain, nausea or vomiting, chest pain, abdominal pain, hip pain.  Past Medical History:  Diagnosis Date  . Cancer ([place])    Thyroid  . Depression   . Glaucoma   . H/O: depression   . History of chicken pox   . Palpitations   . Thyroid cancer ([place])   . Thyroid disease     Patient Active Problem List   Diagnosis Date Noted  . Memory change 04/20/2016  . Elevated blood pressure reading 04/20/2016  . Hyperglycemia 10/03/2015  . Osteopenia 07/18/2015  . Disease of thyroid gland 07/18/2015  . Loss of weight 04/16/2015  . Health care maintenance 01/13/2015  . Beat, premature ventricular 09/11/2014  . Abdominal cramping 08/09/2014  . Acute diarrhea 08/09/2014  . Abnormal liver enzymes 08/09/2014  . Lump in neck 05/19/2014  . Localized swelling, mass, and lump of head 05/19/2014  . Abnormal liver function tests 05/14/2014  . Edema 05/14/2014  . Abnormal blood chemistry 05/14/2014  . Hypercholesterolemia 03/14/2014  . Pure hypercholesterolemia 03/14/2014  . Constipation 01/04/2014  . Anemia 01/04/2014    . Palpitations 10/01/2013  . Glaucoma 10/01/2013  . Depression 10/01/2013  . Head pain 10/01/2013  . Right hip pain 10/01/2013  . Cephalalgia 10/01/2013  . Malignant neoplasm of thyroid gland ([place]) 10/01/2013  . Arthralgia of hip or thigh 10/01/2013  . Degenerative arthritis of hip 07/28/2012  . HZV (herpes zoster virus) post herpetic neuralgia 03/23/2012  . Fatigue 12/22/2011    Past Surgical History:  Procedure Laterality Date  . ABDOMINAL HYSTERECTOMY    . APPENDECTOMY    . LAPAROSCOPIC HYSTERECTOMY  1970   ovaries left in place  . THYROIDECTOMY    . TONSILLECTOMY  1952    Prior to Admission medications   Medication Sig Start Date End Date Taking? Authorizing Provider  aspirin EC 81 MG tablet Take 81 mg by mouth daily.   Yes [provider]  busPIRone (BUSPAR) 10 MG tablet Take 10 mg by mouth 3 (three) times daily.   Yes [provider]  Diclofenac Sodium 3 % GEL APPLY TOPICALLY TO FACIAL SPOTS ONCE DAILY 01/05/17  Yes Einar Pheasant, MD  donepezil (ARICEPT) 10 MG tablet Take 10 mg by mouth at bedtime.  08/04/16  Yes [provider]  dorzolamide-timolol (COSOPT) 22.3-6.8 MG/ML ophthalmic solution INSTILL 1 DROP INTO BOTH EYES TWICE DAILY 01/04/17  Yes Burchette, Alinda Sierras, MD  hydroxypropyl methylcellulose / hypromellose (ISOPTO TEARS / GONIOVISC) 2.5 % ophthalmic solution Place 2 drops into both eyes 2 (two) times daily.   Yes [provider]  levothyroxine (SYNTHROID, Makena) 75 MCG  tablet TAKE 1 TABLET (75 MCG TOTAL) BY MOUTH DAILY. 08/19/16  Yes Einar Pheasant, MD  liothyronine (CYTOMEL) 5 MCG tablet TAKE 1.5 TABLETS (7.5 MCG TOTAL) BY MOUTH DAILY. 08/19/16  Yes Einar Pheasant, MD  Melatonin 3 MG CAPS Take by mouth Nightly.   Yes [provider]  Multiple Vitamins-Minerals (CENTRUM) tablet Take 1 tablet by mouth daily.   Yes [provider]  Multiple Vitamins-Minerals (PRESERVISION/LUTEIN) CAPS Take by mouth 2 (two)  times daily.   Yes [provider]  naproxen sodium (ALEVE) 220 MG tablet Take 220 mg by mouth every 8 (eight) hours.   Yes [provider]  sertraline (ZOLOFT) 100 MG tablet Take 1 tablet (100 mg total) by mouth daily. Please place in blister packs 08/19/16  Yes Einar Pheasant, MD    Allergies Doxepin  Family History  Problem Relation Age of Onset  . Cancer Mother        multiple myeloma  . Parkinson's disease Father   . Cancer - Lung Brother        x 2  . Breast cancer Neg Hx     Social History Social History  Substance Use Topics  . Smoking status: Never Smoker  . Smokeless tobacco: Never Used  . Alcohol use No    Review of Systems Constitutional: Negative for fever. Eyes: Negative for visual changes. ENT: + facial laceration and abrasion. No neck injury Cardiovascular: Negative for chest injury. Respiratory: Negative for shortness of breath. Negative for chest wall injury. Gastrointestinal: Negative for abdominal pain or injury. Genitourinary: Negative for dysuria. Musculoskeletal: Negative for back injury, negative for arm or leg pain. Skin: + R thumb laceration Neurological: + head injury.   ____________________________________________   PHYSICAL EXAM:  VITAL SIGNS: ED Triage Vitals  Enc Vitals Group     BP 04/22/17 1452 (!) 148/55     Pulse Rate 04/22/17 1452 60     Resp 04/22/17 1452 18     Temp 04/22/17 1452 97.9 F (36.6 C)     Temp Source 04/22/17 1452 Oral     SpO2 04/22/17 1452 99 %     Weight 04/22/17 1453 112 lb (50.8 kg)     Height 04/22/17 1453 '5\' 2"'$  (1.575 m)     Head Circumference --      Peak Flow --      Pain Score 04/22/17 1452 5     Pain Loc --      Pain Edu? --      Excl. in [place]? --    Constitutional: Alert and oriented. No acute distress. Does not appear intoxicated. HEENT Head: Normocephalic, bruising to the L forehead with a shallow 1cm laceration Face: No facial bony tenderness. Stable midface Ears: No  hemotympanum bilaterally. No Battle sign Eyes: No eye injury. PERRL. No raccoon eyes Nose: Nontender. No epistaxis. No rhinorrhea Mouth/Throat: Mucous membranes are moist. No oropharyngeal blood. No dental injury. Airway patent without stridor. Normal voice. Neck: no C-collar in place. No midline c-spine tenderness.  Cardiovascular: Normal rate, regular rhythm. Normal and symmetric distal pulses are present in all extremities. Pulmonary/Chest: Chest wall is stable and nontender to palpation/compression. Normal respiratory effort. Breath sounds are normal. No crepitus.  Abdominal: Soft, nontender, non distended. Musculoskeletal: ttp at the base of the R thumb with skin tear of the tip of the R thumb. Nontender with normal full range of motion in all extremities. No deformities. No thoracic or lumbar midline spinal tenderness. Pelvis is stable. Skin: Skin is warm,  dry and intact. Psychiatric: Speech and behavior are appropriate. Neurological: Normal speech and language. Moves all extremities to command. No gross focal neurologic deficits are appreciated.  Glascow Coma Score: 4 - Opens eyes on own 6 - Follows simple motor commands 5 - Alert and oriented GCS: 15   ____________________________________________   LABS (all labs ordered are listed, but only abnormal results are displayed)  Labs Reviewed - No data to display ____________________________________________  EKG  none  ____________________________________________  RADIOLOGY  CT head/cspine: 1. No evidence of intracranial or cervical spine injury. 2. Left forehead contusion without fracture.  XR R hand:  No acute fracture. Laceration of the thumb.  XR L finger: Small avulsion fracture from the base of the middle phalanx of the left fourth digit ____________________________________________   PROCEDURES  Procedure(s) performed:yes .Marland KitchenLaceration Repair Date/Time: 04/22/2017 4:19 PM Performed by: Rudene Re Authorized by: Rudene Re   Consent:    Consent obtained:  Verbal   Consent given by:  Patient   Risks discussed:  Infection, pain and poor cosmetic result   Alternatives discussed:  No treatment Anesthesia (see MAR for exact dosages):    Anesthesia method:  None Laceration details:    Location:  Face   Face location:  Forehead   Length (cm):  1 Repair type:    Repair type:  Simple Pre-procedure details:    Preparation:  Patient was prepped and draped in usual sterile fashion Exploration:    Hemostasis achieved with:  Direct pressure   Wound extent: no fascia violation noted and no foreign bodies/material noted   Treatment:    Area cleansed with:  Saline   Visualized foreign bodies/material removed: no   Skin repair:    Repair method:  Tissue adhesive and Steri-Strips   Number of Steri-Strips:  1 Approximation:    Approximation:  Close   Vermilion border: well-aligned   Post-procedure details:    Dressing:  Open (no dressing)   Patient tolerance of procedure:  Tolerated well, no immediate complications .Marland KitchenLaceration Repair Date/Time: 04/22/2017 4:20 PM Performed by: Rudene Re Authorized by: Rudene Re   Consent:    Consent obtained:  Verbal   Consent given by:  Patient   Risks discussed:  Infection, pain and poor cosmetic result Anesthesia (see MAR for exact dosages):    Anesthesia method:  None Laceration details:    Location:  Hand   Hand location:  R palm   Length (cm):  1 Repair type:    Repair type:  Simple Exploration:    Hemostasis achieved with:  Direct pressure   Wound extent: no fascia violation noted, no foreign bodies/material noted, no muscle damage noted, no nerve damage noted and no tendon damage noted   Treatment:    Area cleansed with:  Saline Skin repair:    Repair method:  Tissue adhesive and Steri-Strips   Number of Steri-Strips:  2 Approximation:    Approximation:  Close   Vermilion border: well-aligned    Post-procedure details:    Dressing:  Open (no dressing)   Patient tolerance of procedure:  Tolerated well, no immediate complications .Marland KitchenLaceration Repair Date/Time: 04/22/2017 4:21 PM Performed by: Rudene Re Authorized by: Rudene Re   Consent:    Consent obtained:  Verbal   Consent given by:  Patient   Risks discussed:  Infection, pain, retained foreign body, tendon damage, need for additional repair and poor cosmetic result Anesthesia (see MAR for exact dosages):    Anesthesia method:  None Laceration details:  Location:  Finger   Finger location:  R thumb   Length (cm):  1 Repair type:    Repair type:  Simple Exploration:    Wound extent: no fascia violation noted, no foreign bodies/material noted, no nerve damage noted, no tendon damage noted and no underlying fracture noted   Treatment:    Area cleansed with:  Saline Skin repair:    Repair method:  Steri-Strips   Number of Steri-Strips:  3 Approximation:    Approximation:  Loose   Vermilion border: poorly aligned   Post-procedure details:    Dressing:  Bulky dressing   Patient tolerance of procedure:  Tolerated well, no immediate complications .Splint Application Date/Time: 16/06/958 7:36 PM Performed by: Rudene Re Authorized by: Rudene Re   Consent:    Consent obtained:  Verbal   Consent given by:  Patient   Risks discussed:  Discoloration, numbness, pain and swelling Pre-procedure details:    Sensation:  Normal Procedure details:    Laterality:  Left   Location:  Finger   Finger:  L ring finger   Supplies:  Aluminum splint Post-procedure details:    Pain:  Unchanged   Sensation:  Normal   Patient tolerance of procedure:  Tolerated well, no immediate complications      Critical Care performed:  None ____________________________________________   INITIAL IMPRESSION / ASSESSMENT AND PLAN / ED COURSE  83 y.o. female who presents for evaluation of a mechanical  fall.patient with a bruising to her forehead and a small superficial laceration that was repaired per procedure note above. Also had 2 small lacerations to the right hand that were repair as documented above. No fractures. CT head and neck within normal limits. Patient with bruising noted to the ring finger on the left. Rings were able to be removed safely. X-ray showed small avulsion fracture of the base of the middle phalanx. Patient was placed on a splint. Patient was discharged home with follow-up with orthopedics. Return precautions discussed for wound care and signs of infection.  As part of my medical decision making, I reviewed the following data within the [place] notes reviewed and incorporated, Radiograph reviewed , Notes from prior ED visits and [place] Controlled Substance Database    Pertinent labs & imaging results that were available during my care of the patient were reviewed by me and considered in my medical decision making (see chart for details).    ____________________________________________   FINAL CLINICAL IMPRESSION(S) / ED DIAGNOSES  Final diagnoses:  Fall, initial encounter  Contusion of face, initial encounter  Closed nondisplaced fracture of middle phalanx of left ring finger, initial encounter  Laceration of forehead, initial encounter  Laceration of right hand without foreign body, initial encounter      NEW MEDICATIONS STARTED DURING THIS VISIT:  Discharge Medication List as of 04/22/2017  5:02 PM       Note:  This document was prepared using Dragon voice recognition software and may include unintentional dictation errors.    Rudene Re, MD 04/22/17 [phone]

## 2017-04-22 NOTE — Discharge Instructions (Signed)
You were seen in the emergency department after a fall. Luckily all of your imaging studies did not show any evidence of serious injuries other than a left ring finger fracture. Keep your finger in the splint until cleared by orthopedics. Follow up with orthopedics in 1 week. Follow-up with you doctor within the next 2-3 days for further evaluation. Sometimes injuries can present at a later time and therefore it is imperative that you return to the emergency room if you have a severe headache, facial droop, neck pain, numbness or weakness of your extremities, slurred speech, difficulty finding words, chest pain, back pain, abdominal pain, or any other new symptoms that were not present during this visit. You may take Tylenol at home for your pain.

## 2017-04-26 ENCOUNTER — Telehealth: Payer: Self-pay

## 2017-04-26 NOTE — Telephone Encounter (Signed)
Left message to return call to our office for son

## 2017-04-26 NOTE — Telephone Encounter (Signed)
Copied from [place] [id number]. Topic: Inquiry >> Apr 26, 2017  2:20 PM Paige Farmer, NT wrote: Reason for CRM: pt son is needing the CMA or doctor to contact him about some questions with his mother. Dementia, she is also sneaking over the counter medicine inter her room and not telling the nurses at the facility.

## 2017-04-26 NOTE — Telephone Encounter (Signed)
Patient son called and states that [facility] has been getting approval for patient to administer medications on own. Family does not want patient to be able to do that due to cognitive abilities. He also states that she is getting otc meds and hiding them in her room. He wanted to also make you aware that she is becoming more combative with staff as well. Wanted to make note of this in chart so that if facility calls we will not give ok to self administer meds.

## 2017-04-27 NOTE — Telephone Encounter (Signed)
Ok to give order for pt not to self medicate or use otc meds that are not on her [facility]

## 2017-04-27 NOTE — Telephone Encounter (Signed)
Order signed for pt to not self medicate.  Regarding the simethicone order, I am going to hold on signing and have them monitor her symptoms.  If they feel she needs something, let me know.

## 2017-04-27 NOTE — Telephone Encounter (Signed)
Noted.  Given the mention of combative behavior, is he interested in referral for evaluation and treatment recs for this.  If so, I would recommend referral to Dr Nicolasa Ducking (psych) for evaluation and help if medications are needed.

## 2017-04-27 NOTE — Telephone Encounter (Signed)
Called and spoke to son and let him know. The neurologist is working on changing meds and would like to hold off on referral for now. He wanted to know if you could send order stating that patient should not have ability to self medicate and should not have any medications in room otc or prescribed.

## 2017-04-27 NOTE — Telephone Encounter (Signed)
Placed in blue folder for other order for signature then we will fax.

## 2017-04-29 NOTE — Telephone Encounter (Signed)
Order faxed.

## 2017-04-30 ENCOUNTER — Telehealth: Payer: Self-pay | Admitting: Internal Medicine

## 2017-04-30 NOTE — Telephone Encounter (Signed)
Faxed

## 2017-04-30 NOTE — Telephone Encounter (Signed)
Copied from [place] [id number]. Topic: Quick Communication - See Telephone Encounter >> Apr 30, 2017 12:22 PM Boyd Kerbs wrote: CRM for notification. See Telephone encounter for: [place] form [facility] assisted living will be faxing 7 orders and wants to be sure Dr. Nicki Reaper sees them and faxes them back today, Please  04/30/17.

## 2017-04-30 NOTE — Telephone Encounter (Signed)
Placed in blue folder for your review. Just received from fax

## 2017-04-30 NOTE — Telephone Encounter (Signed)
Signed and placed in box.   

## 2017-05-04 ENCOUNTER — Encounter: Payer: Self-pay | Admitting: Internal Medicine

## 2017-05-04 ENCOUNTER — Ambulatory Visit: Payer: Medicare Other | Admitting: Internal Medicine

## 2017-05-04 VITALS — BP 138/80 | HR 71 | Temp 97.7°F | Resp 17 | Ht 61.81 in | Wt 115.2 lb

## 2017-05-04 DIAGNOSIS — F329 Major depressive disorder, single episode, unspecified: Secondary | ICD-10-CM

## 2017-05-04 DIAGNOSIS — C73 Malignant neoplasm of thyroid gland: Secondary | ICD-10-CM

## 2017-05-04 DIAGNOSIS — R220 Localized swelling, mass and lump, head: Secondary | ICD-10-CM

## 2017-05-04 DIAGNOSIS — R413 Other amnesia: Secondary | ICD-10-CM | POA: Diagnosis not present

## 2017-05-04 DIAGNOSIS — D649 Anemia, unspecified: Secondary | ICD-10-CM | POA: Diagnosis not present

## 2017-05-04 DIAGNOSIS — Z1239 Encounter for other screening for malignant neoplasm of breast: Secondary | ICD-10-CM

## 2017-05-04 DIAGNOSIS — F32A Depression, unspecified: Secondary | ICD-10-CM

## 2017-05-04 DIAGNOSIS — Z1231 Encounter for screening mammogram for malignant neoplasm of breast: Secondary | ICD-10-CM

## 2017-05-04 DIAGNOSIS — R030 Elevated blood-pressure reading, without diagnosis of hypertension: Secondary | ICD-10-CM

## 2017-05-04 DIAGNOSIS — R739 Hyperglycemia, unspecified: Secondary | ICD-10-CM

## 2017-05-04 DIAGNOSIS — E7800 Pure hypercholesterolemia, unspecified: Secondary | ICD-10-CM | POA: Diagnosis not present

## 2017-05-04 NOTE — Progress Notes (Signed)
Patient ID: Paige Farmer, female   DOB: 09-04-1930, 83 y.o.   MRN: [id number]   Subjective:    Patient ID: Paige Farmer, female    DOB: [birth year], 83 y.o.   MRN: [id number]  HPI  Patient here for a scheduled follow up.  She is accompanied by her son.  History obtained from both of them.  Was evaluated in ER 04/22/17 after falling.  Hit her head and sustained a small laceration to the tip of the thumb.  Note reviewed.  CT head and neck - ok.  Bruising resolving and finger healed.  No headache.  No dizziness.  Stays active.  No chest pain.  No sob.  No acid reflux.  No abdominal pain.  Bowels moving.  Son had concerns regarding her self medicating.  Had medications in her room.  We have now changed her medications - where she has to ask for her medications. This was discussed at length with her today.     Past Medical History:  Diagnosis Date  . Cancer ([place])    Thyroid  . Depression   . Glaucoma   . H/O: depression   . History of chicken pox   . Palpitations   . Thyroid cancer ([place])   . Thyroid disease    Past Surgical History:  Procedure Laterality Date  . ABDOMINAL HYSTERECTOMY    . APPENDECTOMY    . LAPAROSCOPIC HYSTERECTOMY  1970   ovaries left in place  . THYROIDECTOMY    . TONSILLECTOMY  1952   Family History  Problem Relation Age of Onset  . Cancer Mother        multiple myeloma  . Parkinson's disease Father   . Cancer - Lung Brother        x 2  . Breast cancer Neg Hx    Social History   Socioeconomic History  . Marital status: Widowed    Spouse name: None  . Number of children: None  . Years of education: None  . Highest education level: None  Social Needs  . Financial resource strain: None  . Food insecurity - worry: None  . Food insecurity - inability: None  . Transportation needs - medical: None  . Transportation needs - non-medical: None  Occupational History  . None  Tobacco Use  . Smoking status: Never Smoker  . Smokeless tobacco: Never Used    Substance and Sexual Activity  . Alcohol use: No    Alcohol/week: 0.0 oz  . Drug use: No  . Sexual activity: None  Other Topics Concern  . None  Social History Narrative  . None    Outpatient Encounter Medications as of 05/04/2017  Medication Sig  . aspirin EC 81 MG tablet Take 81 mg by mouth daily.  . busPIRone (BUSPAR) 10 MG tablet Take 10 mg by mouth 3 (three) times daily.  . Diclofenac Sodium 3 % GEL APPLY TOPICALLY TO FACIAL SPOTS ONCE DAILY  . donepezil (ARICEPT) 10 MG tablet Take 10 mg by mouth at bedtime.   . dorzolamide-timolol (COSOPT) 22.3-6.8 MG/ML ophthalmic solution INSTILL 1 DROP INTO BOTH EYES TWICE DAILY  . hydroxypropyl methylcellulose / hypromellose (ISOPTO TEARS / GONIOVISC) 2.5 % ophthalmic solution Place 2 drops into both eyes 2 (two) times daily.  Marland Kitchen levothyroxine (SYNTHROID, LEVOTHROID) 75 MCG tablet TAKE 1 TABLET (75 MCG TOTAL) BY MOUTH DAILY.  Marland Kitchen liothyronine (CYTOMEL) 5 MCG tablet TAKE 1.5 TABLETS (7.5 MCG TOTAL) BY MOUTH DAILY.  . Melatonin 3 MG CAPS Take  by mouth Nightly.  . Multiple Vitamins-Minerals (CENTRUM) tablet Take 1 tablet by mouth daily.  . Multiple Vitamins-Minerals (PRESERVISION/LUTEIN) CAPS Take by mouth 2 (two) times daily.  . naproxen sodium (ALEVE) 220 MG tablet Take 220 mg by mouth every 8 (eight) hours.  . sertraline (ZOLOFT) 100 MG tablet Take 1 tablet (100 mg total) by mouth daily. Please place in blister packs   No facility-administered encounter medications on file as of 05/04/2017.     Review of Systems  Constitutional: Negative for appetite change and unexpected weight change.  HENT: Negative for congestion and sinus pressure.   Respiratory: Negative for cough, chest tightness and shortness of breath.   Cardiovascular: Negative for chest pain, palpitations and leg swelling.  Gastrointestinal: Negative for abdominal pain, diarrhea, nausea and vomiting.  Genitourinary: Negative for difficulty urinating and dysuria.   Musculoskeletal: Negative for joint swelling and myalgias.  Skin: Negative for color change and rash.  Neurological: Negative for dizziness, light-headedness and headaches.  Psychiatric/Behavioral: Negative for agitation and dysphoric mood.       Objective:     Blood pressure rechecked by me:  138/80  Physical Exam  Constitutional: She appears well-developed and well-nourished. No distress.  HENT:  Nose: Nose normal.  Mouth/Throat: Oropharynx is clear and moist.  Neck: Neck supple. No thyromegaly present.  Cardiovascular: Normal rate and regular rhythm.  Pulmonary/Chest: Breath sounds normal. No respiratory distress. She has no wheezes.  Abdominal: Soft. Bowel sounds are normal. There is no tenderness.  Musculoskeletal: She exhibits no edema or tenderness.  Lymphadenopathy:    She has no cervical adenopathy.  Skin: No rash noted. No erythema.  Bruising resolving.    Psychiatric: She has a normal mood and affect. Her behavior is normal.    BP 138/80   Pulse 71   Temp 97.7 F (36.5 C) (Oral)   Resp 17   Ht 5' 1.81" (1.57 m)   Wt 115 lb 3.2 oz (52.3 kg)   SpO2 96%   BMI 21.20 kg/m  Wt Readings from Last 3 Encounters:  05/04/17 115 lb 3.2 oz (52.3 kg)  04/22/17 112 lb (50.8 kg)  03/02/17 117 lb 11.6 oz (53.4 kg)     Lab Results  Component Value Date   WBC 8.6 03/02/2017   HGB 12.0 03/02/2017   HCT 35.5 03/02/2017   PLT 248 03/02/2017   GLUCOSE 82 03/02/2017   CHOL 228 (H) 01/31/2016   TRIG 69.0 01/31/2016   HDL 64.50 01/31/2016   LDLCALC 149 (H) 01/31/2016   ALT 16 03/02/2017   AST 22 03/02/2017   NA 135 03/02/2017   K 3.8 03/02/2017   CL 99 (L) 03/02/2017   CREATININE 0.81 03/02/2017   BUN 16 03/02/2017   CO2 30 03/02/2017   TSH 1.590 03/02/2017   HGBA1C 5.9 01/31/2016    Ct Head Wo Contrast  Result Date: 04/22/2017 CLINICAL DATA:  Minor head trauma. High clinical risk. Fall in bathroom. Left forehead contusion. Initial encounter. EXAM: CT HEAD  WITHOUT CONTRAST CT CERVICAL SPINE WITHOUT CONTRAST TECHNIQUE: Multidetector CT imaging of the head and cervical spine was performed following the standard protocol without intravenous contrast. Multiplanar CT image reconstructions of the cervical spine were also generated. COMPARISON:  Brain MRI 05/25/2016.  Neck CT 04/04/2014 FINDINGS: CT HEAD FINDINGS Brain: No evidence of acute infarction, hemorrhage, hydrocephalus, extra-axial collection or mass lesion/mass effect. Cerebral volume loss and mild microvascular ischemic change. Vascular: No hyperdense vessel or unexpected calcification. Skull: There is a left forehead hematoma.  Negative for underlying fracture. No opaque foreign body. Sinuses/Orbits: No visible postseptal injury on the left. Visualized sinuses and mastoids are clear. CT CERVICAL SPINE FINDINGS Alignment: No traumatic malalignment. Mild degenerative anterolisthesis at C2-3 and C6-7 that is stable. Skull base and vertebrae: Negative for acute fracture. No aggressive process. Soft tissues and spinal canal: No prevertebral fluid or swelling. No visible canal hematoma. Thyroidectomy. Disc levels: Diffuse disc and facet degeneration without evidence of cord impingement. The foramina show no bony stenosis on either side. Upper chest: No evidence of injury IMPRESSION: 1. No evidence of intracranial or cervical spine injury. 2. Left forehead contusion without fracture. Electronically Signed   By: Monte Fantasia M.D.   On: 04/22/2017 16:02   Ct Cervical Spine Wo Contrast  Result Date: 04/22/2017 CLINICAL DATA:  Minor head trauma. High clinical risk. Fall in bathroom. Left forehead contusion. Initial encounter. EXAM: CT HEAD WITHOUT CONTRAST CT CERVICAL SPINE WITHOUT CONTRAST TECHNIQUE: Multidetector CT imaging of the head and cervical spine was performed following the standard protocol without intravenous contrast. Multiplanar CT image reconstructions of the cervical spine were also generated.  COMPARISON:  Brain MRI 05/25/2016.  Neck CT 04/04/2014 FINDINGS: CT HEAD FINDINGS Brain: No evidence of acute infarction, hemorrhage, hydrocephalus, extra-axial collection or mass lesion/mass effect. Cerebral volume loss and mild microvascular ischemic change. Vascular: No hyperdense vessel or unexpected calcification. Skull: There is a left forehead hematoma. Negative for underlying fracture. No opaque foreign body. Sinuses/Orbits: No visible postseptal injury on the left. Visualized sinuses and mastoids are clear. CT CERVICAL SPINE FINDINGS Alignment: No traumatic malalignment. Mild degenerative anterolisthesis at C2-3 and C6-7 that is stable. Skull base and vertebrae: Negative for acute fracture. No aggressive process. Soft tissues and spinal canal: No prevertebral fluid or swelling. No visible canal hematoma. Thyroidectomy. Disc levels: Diffuse disc and facet degeneration without evidence of cord impingement. The foramina show no bony stenosis on either side. Upper chest: No evidence of injury IMPRESSION: 1. No evidence of intracranial or cervical spine injury. 2. Left forehead contusion without fracture. Electronically Signed   By: Monte Fantasia M.D.   On: 04/22/2017 16:02   Dg Hand Complete Right  Result Date: 04/22/2017 CLINICAL DATA:  Fall today- Right hand pain. Pain primarily at the thumb and pointer finger. Lacerations at end of thumb and pointer finger. Pt stated she fell ([place]) and landed on concrete No previous injury EXAM: RIGHT HAND - COMPLETE 3+ VIEW COMPARISON:  None. FINDINGS: There is no acute fracture or subluxation. Soft tissue irregularity identified at the distal aspect of the film. No radiopaque foreign body. IMPRESSION: No acute fracture.  Laceration of the thumb. Electronically Signed   By: Nolon Nations M.D.   On: 04/22/2017 15:45   Dg Finger Ring Left  Result Date: 04/22/2017 CLINICAL DATA:  Jammed hand into concrete with pain and swelling of the fourth digit EXAM: LEFT  RING FINGER 2+V COMPARISON:  None. FINDINGS: There is a small avulsion fracture fragment from the base of the middle phalanx of the left fourth digit on the palmar aspect. No other acute abnormality is seen. Joint spaces appear normal. IMPRESSION: Small avulsion fracture from the base of the middle phalanx of the left fourth digit. Electronically Signed   By: Ivar Drape M.D.   On: 04/22/2017 16:44       Assessment & Plan:   Problem List Items Addressed This Visit    Anemia    Last hgb 02/2017 - 11.2.  Follow.  Depression    On zoloft and buspar.  Doing well.  Follow.        Elevated blood pressure reading    Rechecked improved.  Follow.       Hypercholesterolemia    Follow lipid panel.        Hyperglycemia    Follow met b and a1c.       Localized swelling, mass, and lump of head    S/p fall.  Bruising resolving.  No headache.  No dizziness.        Malignant neoplasm of thyroid gland [facility])    Seeing oncology.  Stable.        Memory change    Has been diagnosed with mixed dementia.  On aricept.  Now on '10mg'$  q day.  Follow.  Continue f/u with neurology.        Other Visit Diagnoses    Screening for breast cancer    -  Primary   Relevant Orders   MM DIGITAL SCREENING BILATERAL       Einar Pheasant, MD

## 2017-05-08 ENCOUNTER — Encounter: Payer: Self-pay | Admitting: Internal Medicine

## 2017-05-08 NOTE — Assessment & Plan Note (Signed)
S/p fall.  Bruising resolving.  No headache.  No dizziness.

## 2017-05-08 NOTE — Assessment & Plan Note (Signed)
Rechecked improved.  Follow.

## 2017-05-08 NOTE — Assessment & Plan Note (Signed)
Has been diagnosed with mixed dementia.  On aricept.  Now on 10mg  q day.  Follow.  Continue f/u with neurology.

## 2017-05-08 NOTE — Assessment & Plan Note (Signed)
Follow met b and a1c.  

## 2017-05-08 NOTE — Assessment & Plan Note (Signed)
On zoloft and buspar.  Doing well.  Follow.

## 2017-05-08 NOTE — Assessment & Plan Note (Signed)
Seeing oncology.  Stable.

## 2017-05-08 NOTE — Assessment & Plan Note (Signed)
Last hgb 02/2017 - 11.2.  Follow.

## 2017-05-08 NOTE — Assessment & Plan Note (Signed)
Follow lipid panel.   

## 2017-05-10 ENCOUNTER — Telehealth: Payer: Self-pay

## 2017-05-10 NOTE — Telephone Encounter (Signed)
Copied from [place] [phone]. Topic: Quick Communication - See Telephone Encounter >> Apr 30, 2017 12:22 PM Boyd Kerbs wrote: CRM for notification. See Telephone encounter for: [place] form [facility] assisted living will be faxing 7 orders and wants to be sure Dr. Nicki Reaper sees them and faxes them back today, Please  04/30/17. >> May 10, 2017  3:24 PM Vernona Rieger wrote: [place] called again and said she finally got it all faxed. She was using the wrong number all last week, she said she needs it signed and please sent back asap. Thanks [phone]

## 2017-05-11 NOTE — Telephone Encounter (Signed)
Orders have been faxed x 3

## 2017-05-12 ENCOUNTER — Telehealth: Payer: Self-pay

## 2017-05-12 NOTE — Telephone Encounter (Signed)
Gave ppw ot Paige Farmer to email

## 2017-05-12 NOTE — Telephone Encounter (Signed)
Copied from [place] [phone]. Topic: Quick Communication - See Telephone Encounter >> Apr 30, 2017 12:22 PM Boyd Kerbs wrote: CRM for notification. See Telephone encounter for: [place] form [facility] assisted living will be faxing 7 orders and wants to be sure Dr. Nicki Reaper sees them and faxes them back today, Please  04/30/17. >> May 10, 2017  3:24 PM Vernona Rieger wrote: [place] called again and said she finally got it all faxed. She was using the wrong number all last week, she said she needs it signed and please sent back asap. Thanks [phone] >> May 12, 2017 10:06 AM Burnis Medin, NT wrote: Levada Dy the Wellness Director from [facility] is calling back about paperwork that has not yet been signed and fax back. She says that they are not out of compliance with the state and this paperwork has to be faxed back as soon as possible. Fax number [phone]

## 2017-06-18 ENCOUNTER — Encounter: Payer: Self-pay | Admitting: Emergency Medicine

## 2017-06-18 ENCOUNTER — Other Ambulatory Visit: Payer: Self-pay

## 2017-06-18 ENCOUNTER — Ambulatory Visit
Admission: EM | Admit: 2017-06-18 | Discharge: 2017-06-18 | Disposition: A | Discharge status: Home / Self Care | Payer: Medicare Other | Attending: Family Medicine | Admitting: Family Medicine

## 2017-06-18 DIAGNOSIS — L02415 Cutaneous abscess of right lower limb: Secondary | ICD-10-CM | POA: Diagnosis not present

## 2017-06-18 DIAGNOSIS — L03115 Cellulitis of right lower limb: Secondary | ICD-10-CM

## 2017-06-18 HISTORY — DX: Unspecified dementia, unspecified severity, without behavioral disturbance, psychotic disturbance, mood disturbance, and anxiety: F03.90

## 2017-06-18 MED ORDER — CEPHALEXIN 500 MG PO CAPS: 500.0000 mg | ORAL_CAPSULE | Freq: Four times a day (QID) | ORAL | 0 refills | Status: DC

## 2017-06-18 NOTE — Discharge Instructions (Signed)
Elevate right leg every 3 hours and apply warm moist compress (washcloth or rag) for 20-30 minutes during waking hours Monitor for worsening symptoms (increase redness or pain) and follow up as needed

## 2017-06-18 NOTE — ED Provider Notes (Signed)
[facility]    CSN: [id number] Arrival date & time: 06/18/17  0935     History   Chief Complaint Chief Complaint  Patient presents with  . Abscess    HPI Paige Farmer is a 83 y.o. female.   83 yo female with a c/o 3 days of right leg skin lesion with redness and tenderness that developed after shaving legs. Denies any fevers or chills. Had some slight drainage yesterday.    The history is provided by the patient and a relative.    Past Medical History:  Diagnosis Date  . Cancer ([place])    Thyroid  . Dementia   . Depression   . Glaucoma   . H/O: depression   . History of chicken pox   . Palpitations   . Thyroid cancer ([place])   . Thyroid disease     Patient Active Problem List   Diagnosis Date Noted  . Memory change 04/20/2016  . Elevated blood pressure reading 04/20/2016  . Hyperglycemia 10/03/2015  . Osteopenia 07/18/2015  . Disease of thyroid gland 07/18/2015  . Loss of weight 04/16/2015  . Health care maintenance 01/13/2015  . Beat, premature ventricular 09/11/2014  . Abdominal cramping 08/09/2014  . Acute diarrhea 08/09/2014  . Abnormal liver enzymes 08/09/2014  . Lump in neck 05/19/2014  . Localized swelling, mass, and lump of head 05/19/2014  . Abnormal liver function tests 05/14/2014  . Edema 05/14/2014  . Abnormal blood chemistry 05/14/2014  . Hypercholesterolemia 03/14/2014  . Pure hypercholesterolemia 03/14/2014  . Constipation 01/04/2014  . Anemia 01/04/2014  . Palpitations 10/01/2013  . Glaucoma 10/01/2013  . Depression 10/01/2013  . Head pain 10/01/2013  . Right hip pain 10/01/2013  . Cephalalgia 10/01/2013  . Malignant neoplasm of thyroid gland ([place]) 10/01/2013  . Arthralgia of hip or thigh 10/01/2013  . Degenerative arthritis of hip 07/28/2012  . HZV (herpes zoster virus) post herpetic neuralgia 03/23/2012  . Fatigue 12/22/2011    Past Surgical History:  Procedure Laterality Date  . ABDOMINAL HYSTERECTOMY    .  APPENDECTOMY    . LAPAROSCOPIC HYSTERECTOMY  1970   ovaries left in place  . THYROIDECTOMY    . TONSILLECTOMY  1952    OB History    No data available       Home Medications    Prior to Admission medications   Medication Sig Start Date End Date Taking? Authorizing Provider  aspirin EC 81 MG tablet Take 81 mg by mouth daily.   Yes [provider]  busPIRone (BUSPAR) 10 MG tablet Take 10 mg by mouth 3 (three) times daily.   Yes [provider]  donepezil (ARICEPT) 10 MG tablet Take 10 mg by mouth at bedtime.  08/04/16  Yes [provider]  dorzolamide-timolol (COSOPT) 22.3-6.8 MG/ML ophthalmic solution INSTILL 1 DROP INTO BOTH EYES TWICE DAILY 01/04/17  Yes Burchette, Alinda Sierras, MD  hydroxypropyl methylcellulose / hypromellose (ISOPTO TEARS / GONIOVISC) 2.5 % ophthalmic solution Place 2 drops into both eyes 2 (two) times daily.   Yes [provider]  levothyroxine (SYNTHROID, LEVOTHROID) 75 MCG tablet TAKE 1 TABLET (75 MCG TOTAL) BY MOUTH DAILY. 08/19/16  Yes Einar Pheasant, MD  liothyronine (CYTOMEL) 5 MCG tablet TAKE 1.5 TABLETS (7.5 MCG TOTAL) BY MOUTH DAILY. 08/19/16  Yes Einar Pheasant, MD  Melatonin 3 MG CAPS Take by mouth Nightly.   Yes [provider]  Multiple Vitamins-Minerals (CENTRUM) tablet Take 1 tablet by mouth daily.   Yes [provider]  Multiple Vitamins-Minerals (PRESERVISION/LUTEIN) CAPS Take by mouth 2 (two) times daily.   Yes [provider]  naproxen sodium (ALEVE) 220 MG tablet Take 220 mg by mouth every 8 (eight) hours.   Yes [provider]  sertraline (ZOLOFT) 100 MG tablet Take 1 tablet (100 mg total) by mouth daily. Please place in blister packs 08/19/16  Yes Einar Pheasant, MD  cephALEXin (KEFLEX) 500 MG capsule Take 1 capsule (500 mg total) by mouth 4 (four) times daily. 06/18/17   Norval Gable, MD  Diclofenac Sodium 3 % GEL APPLY TOPICALLY TO FACIAL SPOTS ONCE DAILY 01/05/17   Einar Pheasant, MD    Family History Family History  Problem Relation Age of Onset  . Cancer Mother        multiple myeloma  . Parkinson's disease Father   . Cancer - Lung Brother        x 2  . Breast cancer Neg Hx     Social History Social History   Tobacco Use  . Smoking status: Never Smoker  . Smokeless tobacco: Never Used  Substance Use Topics  . Alcohol use: No    Alcohol/week: 0.0 oz  . Drug use: No     Allergies   Doxepin   Review of Systems Review of Systems   Physical Exam Triage Vital Signs ED Triage Vitals  Enc Vitals Group     BP 06/18/17 0947 116/77     Pulse Rate 06/18/17 0947 65     Resp 06/18/17 0947 16     Temp 06/18/17 0947 98 F (36.7 C)     Temp Source 06/18/17 0947 Oral     SpO2 06/18/17 0947 100 %     Weight 06/18/17 0947 110 lb (49.9 kg)     Height 06/18/17 0947 '5\' 2"'$  (1.575 m)     Head Circumference --      Peak Flow --      Pain Score 06/18/17 0948 5     Pain Loc --      Pain Edu? --      Excl. in [place]? --    No data found.  Updated Vital Signs BP 116/77 (BP Location: Left Arm)   Pulse 65   Temp 98 F (36.7 C) (Oral)   Resp 16   Ht '5\' 2"'$  (1.575 m)   Wt 110 lb (49.9 kg)   SpO2 100%   BMI 20.12 kg/m   Visual Acuity Right Eye Distance:   Left Eye Distance:   Bilateral Distance:    Right Eye Near:   Left Eye Near:    Bilateral Near:     Physical Exam  Constitutional: She appears well-developed and well-nourished. No distress.  Skin: She is not diaphoretic. There is erythema.  2cm slightly indurated, erythematous, tender, subcutaneous nodular area on right lower extremity with surrounding blanchable erythema and warmth  Nursing note and vitals reviewed.    UC Treatments / Results  Labs (all labs ordered are listed, but only abnormal results are displayed) Labs Reviewed - No data to display  EKG  EKG Interpretation None       Radiology No results found.  Procedures Procedures (including critical care  time)  Medications Ordered in UC Medications - No data to display   Initial Impression / Assessment and Plan / UC Course  I have reviewed the triage vital signs and the nursing notes.  Pertinent labs & imaging results that were available during my care of the patient  were reviewed by me and considered in my medical decision making (see chart for details).       Final Clinical Impressions(s) / UC Diagnoses   Final diagnoses:  Abscess of right leg  Cellulitis of leg, right    ED Discharge Orders        Ordered    cephALEXin (KEFLEX) 500 MG capsule  4 times daily     06/18/17 1027     1. diagnosis reviewed with patient and son 2. rx as per orders above; reviewed possible side effects, interactions, risks and benefits  3. Recommend supportive treatment with warm compresses to area 4. Follow-up prn if symptoms worsen or don't improve   Controlled Substance Prescriptions [place] Controlled Substance Registry consulted? Not Applicable   Norval Gable, MD 06/18/17 1300

## 2017-06-18 NOTE — ED Triage Notes (Signed)
Patient in today c/o 3-4 days of right leg pain and swelling. Possible abscess. Patient states she shaved her legs the other day and some drainage came out. Patient denies fever.

## 2017-06-24 ENCOUNTER — Telehealth: Payer: Self-pay

## 2017-06-24 NOTE — Telephone Encounter (Signed)
Copied from [place] [id number]. Topic: General - Other >> Jun 24, 2017  1:17 PM Patrice Paradise wrote: Reason for CRM: [place] from [place] @ [phone] called to say that a fax for doctor order has been sent. Please sign and fax back to [phone] as soon as possible.

## 2017-06-25 NOTE — Telephone Encounter (Signed)
Have not received fax as of yet.

## 2017-07-05 NOTE — Telephone Encounter (Signed)
Still have not received.

## 2017-07-05 NOTE — Telephone Encounter (Signed)
Spoke with [place] from [facility]. She has received signed orders from [place] via fax.

## 2017-07-13 ENCOUNTER — Telehealth: Payer: Self-pay

## 2017-07-13 NOTE — Telephone Encounter (Signed)
Copied from [place]. Topic: Inquiry >> Jul 13, 2017  1:34 PM Ether Griffins B wrote: Reason for CRM: Levada Dy with [facility] calling to let office know they are faxing over a physicians orders needing them signed and faxed back ASAP due to pharmacy stating they will not send anymore medications until this fax is received.

## 2017-07-14 NOTE — Telephone Encounter (Signed)
I have tried calling [facility] 3 times today and cannot reach them. I do not have this paper work in Aud[employer]. Do you have this?

## 2017-07-14 NOTE — Telephone Encounter (Signed)
I do not have a form from the pharmacy.  I have signed a form previously with a list of her medications (within the last 1-2 weeks).   This should have already been faxed.  Need to confirm exactly what they need and need them to fax again if they do not have.  Thanks   I am sending this message to multiple recipients.  Not sure who is in the office.

## 2017-07-15 NOTE — Telephone Encounter (Signed)
Faxed to [facility] x 2.

## 2017-08-26 ENCOUNTER — Telehealth: Payer: Self-pay | Admitting: Internal Medicine

## 2017-08-26 NOTE — Telephone Encounter (Signed)
Copied from [place]. Topic: Quick Communication - See Telephone Encounter >> Aug 26, 2017  1:40 PM Ether Griffins B wrote: CRM for notification. See Telephone encounter for:  Levada Dy with [facility] letting the office know an FL2 and care plan will be faxed over on the patient and she is hoping to have it faxed back asap.  08/26/17.

## 2017-08-26 NOTE — Telephone Encounter (Signed)
fyi

## 2017-08-27 ENCOUNTER — Other Ambulatory Visit: Payer: Self-pay

## 2017-08-27 NOTE — Telephone Encounter (Signed)
Faxed to [facility]

## 2017-08-27 NOTE — Telephone Encounter (Signed)
Received fax. Going over paperwork to ensure that they match our system.

## 2017-08-31 ENCOUNTER — Inpatient Hospital Stay (HOSPITAL_BASED_OUTPATIENT_CLINIC_OR_DEPARTMENT_OTHER): Payer: Medicare Other | Admitting: Internal Medicine

## 2017-08-31 ENCOUNTER — Inpatient Hospital Stay: Payer: Medicare Other | Attending: Internal Medicine

## 2017-08-31 ENCOUNTER — Encounter: Payer: Self-pay | Admitting: Internal Medicine

## 2017-08-31 ENCOUNTER — Other Ambulatory Visit: Payer: Self-pay

## 2017-08-31 VITALS — BP 106/75 | HR 52 | Temp 97.6°F | Resp 20 | Ht 62.0 in | Wt 114.6 lb

## 2017-08-31 DIAGNOSIS — Z85850 Personal history of malignant neoplasm of thyroid: Secondary | ICD-10-CM | POA: Insufficient documentation

## 2017-08-31 DIAGNOSIS — F0390 Unspecified dementia without behavioral disturbance: Secondary | ICD-10-CM | POA: Insufficient documentation

## 2017-08-31 DIAGNOSIS — C73 Malignant neoplasm of thyroid gland: Secondary | ICD-10-CM

## 2017-08-31 LAB — CBC WITH DIFFERENTIAL/PLATELET
Basophils Absolute: 0 10*3/uL (ref 0–0.1)
Basophils Relative: 1 %
EOS PCT: 2 %
Eosinophils Absolute: 0.1 10*3/uL (ref 0–0.7)
HCT: 36.7 % (ref 35.0–47.0)
HEMOGLOBIN: 12.3 g/dL (ref 12.0–16.0)
LYMPHS ABS: 2.7 10*3/uL (ref 1.0–3.6)
LYMPHS PCT: 38 %
MCH: 30.3 pg (ref 26.0–34.0)
MCHC: 33.6 g/dL (ref 32.0–36.0)
MCV: 90.2 fL (ref 80.0–100.0)
Monocytes Absolute: 0.9 10*3/uL (ref 0.2–0.9)
Monocytes Relative: 13 %
Neutro Abs: 3.2 10*3/uL (ref 1.4–6.5)
Neutrophils Relative %: 46 %
PLATELETS: 260 10*3/uL (ref 150–440)
RBC: 4.07 MIL/uL (ref 3.80–5.20)
RDW: 14.4 % (ref 11.5–14.5)
WBC: 6.9 10*3/uL (ref 3.6–11.0)

## 2017-08-31 LAB — COMPREHENSIVE METABOLIC PANEL
ALT: 15 U/L (ref 14–54)
AST: 19 U/L (ref 15–41)
Albumin: 3.6 g/dL (ref 3.5–5.0)
Alkaline Phosphatase: 57 U/L (ref 38–126)
Anion gap: 6 (ref 5–15)
BUN: 19 mg/dL (ref 6–20)
CHLORIDE: 103 mmol/L (ref 101–111)
CO2: 29 mmol/L (ref 22–32)
Calcium: 8.7 mg/dL — ABNORMAL LOW (ref 8.9–10.3)
Creatinine, Ser: 1 mg/dL (ref 0.44–1.00)
GFR calc Af Amer: 58 mL/min — ABNORMAL LOW (ref >60)
GFR, EST NON AFRICAN AMERICAN: 50 mL/min — AB (ref >60)
Glucose, Bld: 68 mg/dL (ref 65–99)
POTASSIUM: 3.7 mmol/L (ref 3.5–5.1)
SODIUM: 138 mmol/L (ref 135–145)
Total Bilirubin: 0.4 mg/dL (ref 0.3–1.2)
Total Protein: 6.6 g/dL (ref 6.5–8.1)

## 2017-08-31 NOTE — Assessment & Plan Note (Addendum)
#   Thyroid cancer T2 NX; status post RAI U [2007]- currently on Synthroid 173mcg/d + cytomel 5 mcg/d .   # Clinically no evidence of recurrence noted. Sep 2018 Thyroglobulin- Neg; labs from today pending.  Given patient's age/mild to moderate dementia-I think it reasonable to space out the follow-up visits every year [as per patient/family request].   # Dementia- alziehmer/vascular- mild to mod- assisted living.  Continue current dose of Synthroid.  # Patient follow-up with me with lab at [facility]; thyroid profile/ thyroglobulin in 12 months [pt preference]. We will call son with results.  Cc; Dr.Scott

## 2017-08-31 NOTE — Progress Notes (Signed)
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 Synthroid 118mg/d + cytomel 5 mc/d; CT Oct 2015- NEG  # AUG 2017 ? 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   Patient continues to live in assisted living; she is accompanied by son.  Most recently she had a fall; and bruised her right hand 2 fingers.  No fractures noted.  Otherwise denies any unusual shortness of breath or cough.  Denies any lumps or bumps. No palpitations no falls. No weight loss.    REVIEW OF SYSTEMS:  A complete 10 point review of system is done which is negative except mentioned above/history of present illness.   PAST MEDICAL HISTORY :  Past Medical History:  Diagnosis Date  . Cancer ([place]    Thyroid  . Dementia   . Depression   . Glaucoma   . H/O: depression   . History of chicken pox   . Palpitations   . [place]   . Thyroid disease     PAST SURGICAL HISTORY :   Past Surgical History:  Procedure Laterality Date  . ABDOMINAL HYSTERECTOMY    . APPENDECTOMY    . LAPAROSCOPIC HYSTERECTOMY  1970   ovaries left in place  . THYROIDECTOMY    . TONSILLECTOMY  1952    FAMILY HISTORY :   Family History  Problem Relation Age of Onset  . Cancer Mother        multiple myeloma  . Parkinson's disease Father   . Cancer - Lung Brother        x 2  . Breast cancer Neg Hx     SOCIAL HISTORY:   Social History   Tobacco  Use  . Smoking status: Never Smoker  . Smokeless tobacco: Never Used  Substance Use Topics  . Alcohol use: No    Alcohol/week: 0.0 oz  . Drug use: No    ALLERGIES:  is allergic to doxepin.  MEDICATIONS:  Current Outpatient Medications  Medication Sig Dispense Refill  . aspirin EC 81 MG tablet Take 81 mg by mouth daily.    . busPIRone (BUSPAR) 10 MG tablet Take 10 mg by mouth 3 (three) times daily.    .Marland Kitchendonepezil (ARICEPT) 10 MG tablet Take 10 mg by mouth at bedtime.     . dorzolamide-timolol (COSOPT) 22.3-6.8 MG/ML ophthalmic solution INSTILL 1 DROP INTO BOTH EYES TWICE DAILY 10 mL 0  . hydroxypropyl methylcellulose / hypromellose (ISOPTO TEARS / GONIOVISC) 2.5 % ophthalmic solution Place 2 drops into both eyes 2 (two) times daily.    .Marland Kitchenlevothyroxine (SYNTHROID, LEVOTHROID) 75 MCG tablet TAKE 1 TABLET (75 MCG TOTAL) BY MOUTH DAILY. 30 tablet 11  . liothyronine (CYTOMEL) 5 MCG tablet TAKE 1.5 TABLETS (7.5 MCG TOTAL) BY MOUTH DAILY. 45 tablet 11  . Melatonin 3 MG CAPS Take 1 capsule by mouth Nightly.     . Multiple Vitamins-Minerals (CENTRUM) tablet Take 1 tablet by mouth daily.    .Marland Kitchen  Multiple Vitamins-Minerals (PRESERVISION/LUTEIN) CAPS Take by mouth 2 (two) times daily.    . naproxen sodium (ALEVE) 220 MG tablet Take 220 mg by mouth every 8 (eight) hours.    . sertraline (ZOLOFT) 100 MG tablet Take 1 tablet (100 mg total) by mouth daily. Please place in blister packs 30 tablet 6  . loperamide (IMODIUM A-D) 2 MG tablet Take 2 mg by mouth 4 (four) times daily as needed for diarrhea or loose stools.     No current facility-administered medications for this visit.     PHYSICAL EXAMINATION: ECOG PERFORMANCE STATUS: 0  BP 106/75 (BP Location: Left Arm, Patient Position: Sitting)   Pulse (!) 52   Temp 97.6 F (36.4 C) (Tympanic)   Resp 20   Ht _0  (1.575 m)   Wt 114 lb 10.2 oz (52 kg)   BMI 20.97 kg/m   Filed Weights   08/31/17 0947  Weight: 114 lb 10.2 oz (52 kg)     GENERAL: Well-nourished well-developed; Alert, no distress and comfortable.  She is Accompanied by her son.  EYES: no pallor or icterus OROPHARYNX: no thrush or ulceration; good dentition  NECK: supple,  thyroidectomy scar noted.no lymph nodes noted. LYMPH:  no palpable lymphadenopathy in the cervical, axillary or inguinal regions LUNGS: clear to auscultation and  No wheeze or crackles HEART/CVS: regular rate & rhythm and no murmurs; No lower extremity edema ABDOMEN:abdomen soft, non-tender and normal bowel sounds Musculoskeletal:no cyanosis of digits and no clubbing  PSYCH: alert & oriented x 3 with fluent speech NEURO: no focal motor/sensory deficits SKIN:  no rashes or significant lesions  LABORATORY DATA:  I have reviewed the data as listed    Component Value Date/Time   NA 138 08/31/2017 0927   NA 139 02/26/2017   NA 141 08/08/2012 1012   K 3.7 08/31/2017 0927   K 3.8 08/08/2012 1012   CL 103 08/31/2017 0927   CL 104 08/08/2012 1012   CO2 29 08/31/2017 0927   CO2 32 08/08/2012 1012   GLUCOSE 68 08/31/2017 0927   GLUCOSE 93 08/08/2012 1012   BUN 19 08/31/2017 0927   BUN 15 02/26/2017   BUN 18 08/08/2012 1012   CREATININE 1.00 08/31/2017 0927   CREATININE 0.85 03/27/2014 1142   CALCIUM 8.7 (L) 08/31/2017 0927   CALCIUM 8.9 08/08/2012 1012   PROT 6.6 08/31/2017 0927   PROT 6.5 08/08/2012 1012   ALBUMIN 3.6 08/31/2017 0927   ALBUMIN 3.5 08/08/2012 1012   AST 19 08/31/2017 0927   AST 17 08/08/2012 1012   ALT 15 08/31/2017 0927   ALT 18 08/08/2012 1012   ALKPHOS 57 08/31/2017 0927   ALKPHOS 63 08/08/2012 1012   BILITOT 0.4 08/31/2017 0927   BILITOT 0.4 08/08/2012 1012   GFRNONAA 50 (L) 08/31/2017 0927   GFRNONAA >60 03/27/2014 1142   GFRNONAA >60 08/08/2012 1012   GFRAA 58 (L) 08/31/2017 0927   GFRAA >60 03/27/2014 1142   GFRAA >60 08/08/2012 1012    No results found for: SPEP, UPEP  Lab Results  Component Value Date   WBC 6.9 08/31/2017   NEUTROABS  3.2 08/31/2017   HGB 12.3 08/31/2017   HCT 36.7 08/31/2017   MCV 90.2 08/31/2017   PLT 260 08/31/2017      Chemistry      Component Value Date/Time   NA 138 08/31/2017 0927   NA 139 02/26/2017   NA 141 08/08/2012 1012   K 3.7 08/31/2017 0927   K 3.8 08/08/2012  1012   CL 103 08/31/2017 0927   CL 104 08/08/2012 1012   CO2 29 08/31/2017 0927   CO2 32 08/08/2012 1012   BUN 19 08/31/2017 0927   BUN 15 02/26/2017   BUN 18 08/08/2012 1012   CREATININE 1.00 08/31/2017 0927   CREATININE 0.85 03/27/2014 1142   GLU 99 02/26/2017      Component Value Date/Time   CALCIUM 8.7 (L) 08/31/2017 0927   CALCIUM 8.9 08/08/2012 1012   ALKPHOS 57 08/31/2017 0927   ALKPHOS 63 08/08/2012 1012   AST 19 08/31/2017 0927   AST 17 08/08/2012 1012   ALT 15 08/31/2017 0927   ALT 18 08/08/2012 1012   BILITOT 0.4 08/31/2017 0927   BILITOT 0.4 08/08/2012 1012       ASSESSMENT & PLAN:   Malignant neoplasm of thyroid gland ([place]) # Thyroid cancer T2 NX; status post RAI U [2007]- currently on Synthroid 151mg/d + cytomel 5 mcg/d .   # Clinically no evidence of recurrence noted. Sep 2018 Thyroglobulin- Neg; labs from today pending.  Given patient's age/mild to moderate dementia-I think it reasonable to space out the follow-up visits every year [as per patient/family request].   # Dementia- alziehmer/vascular- mild to mod- assisted living.  Continue current dose of Synthroid.  # [facility] [place]/ thyroglobulin in 12 months [pt preference]. We will call son with results.  Cc; Dr.Scott     GCammie Sickle MD 09/01/2017 7:10 AM

## 2017-09-01 ENCOUNTER — Telehealth: Payer: Self-pay | Admitting: Internal Medicine

## 2017-09-01 LAB — THYROGLOBULIN BY IMA: Thyroglobulin by IMA: 0.9 ng/mL — ABNORMAL LOW (ref 1.5–38.5)

## 2017-09-01 LAB — THYROID PANEL WITH TSH
Free Thyroxine Index: 1.5 (ref 1.2–4.9)
T3 Uptake Ratio: 24 % (ref 24–39)
T4, Total: 6.1 ug/dL (ref 4.5–12.0)
TSH: 2.83 u[IU]/mL (ref 0.450–4.500)

## 2017-09-01 LAB — TGAB+THYROGLOBULIN IMA OR RIA

## 2017-09-01 NOTE — Telephone Encounter (Signed)
Copied from [place] [id number]. Topic: Quick Communication - See Telephone Encounter >> Sep 01, 2017  8:47 AM Burnis Medin, NT wrote: CRM for notification. See Telephone encounter for: [place] is calling because patient has a stye on her eye and wanted to see if the doctor could call in a prescription for an ointment to [facility] [place] [phone] (Phone) [phone] (Fax)    09/01/17.

## 2017-09-01 NOTE — Telephone Encounter (Signed)
Left VM for pt to call back re: symptoms.

## 2017-09-01 NOTE — Telephone Encounter (Signed)
Pt's nurse from [facility] called to report pt has a "stye" on her right eye, lower lid. States lower lid pink with "irritation." Teary, no itching or pain. Facility nurse requesting ointment called in to RX.  Please advise: [phone]

## 2017-09-02 ENCOUNTER — Telehealth: Payer: Self-pay | Admitting: Internal Medicine

## 2017-09-02 NOTE — Telephone Encounter (Signed)
Advised [place] to reach out to patient's eye dr and call us back if she is not able to get anything.

## 2017-09-02 NOTE — Telephone Encounter (Signed)
Please inform patient son that labs are thyroid labs are normal; follow-up as planned no new recommendations.

## 2017-09-02 NOTE — Telephone Encounter (Signed)
Paige Farmer is calling back to follow up on getting a medication called in for a Right stye. She states she missed 2 calls yesterday evening. CB# [phone]

## 2017-10-11 ENCOUNTER — Emergency Department
Admission: EM | Admit: 2017-10-11 | Discharge: 2017-10-11 | Disposition: A | Discharge status: Home / Self Care | Payer: Medicare Other | Attending: Emergency Medicine | Admitting: Emergency Medicine

## 2017-10-11 ENCOUNTER — Other Ambulatory Visit: Payer: Self-pay

## 2017-10-11 ENCOUNTER — Encounter: Payer: Self-pay | Admitting: Emergency Medicine

## 2017-10-11 ENCOUNTER — Telehealth: Payer: Self-pay | Admitting: Internal Medicine

## 2017-10-11 ENCOUNTER — Emergency Department: Payer: Medicare Other

## 2017-10-11 DIAGNOSIS — W0110XA Fall on same level from slipping, tripping and stumbling with subsequent striking against unspecified object, initial encounter: Secondary | ICD-10-CM | POA: Insufficient documentation

## 2017-10-11 DIAGNOSIS — I10 Essential (primary) hypertension: Secondary | ICD-10-CM | POA: Diagnosis not present

## 2017-10-11 DIAGNOSIS — Z85850 Personal history of malignant neoplasm of thyroid: Secondary | ICD-10-CM | POA: Insufficient documentation

## 2017-10-11 DIAGNOSIS — W19XXXA Unspecified fall, initial encounter: Secondary | ICD-10-CM

## 2017-10-11 DIAGNOSIS — Y9389 Activity, other specified: Secondary | ICD-10-CM | POA: Diagnosis not present

## 2017-10-11 DIAGNOSIS — F329 Major depressive disorder, single episode, unspecified: Secondary | ICD-10-CM | POA: Insufficient documentation

## 2017-10-11 DIAGNOSIS — Z79899 Other long term (current) drug therapy: Secondary | ICD-10-CM | POA: Diagnosis not present

## 2017-10-11 DIAGNOSIS — S0990XA Unspecified injury of head, initial encounter: Secondary | ICD-10-CM | POA: Insufficient documentation

## 2017-10-11 DIAGNOSIS — F0390 Unspecified dementia without behavioral disturbance: Secondary | ICD-10-CM | POA: Insufficient documentation

## 2017-10-11 DIAGNOSIS — Y998 Other external cause status: Secondary | ICD-10-CM | POA: Diagnosis not present

## 2017-10-11 DIAGNOSIS — Z7982 Long term (current) use of aspirin: Secondary | ICD-10-CM | POA: Insufficient documentation

## 2017-10-11 DIAGNOSIS — Y92000 Kitchen of unspecified non-institutional (private) residence as  the place of occurrence of the external cause: Secondary | ICD-10-CM | POA: Diagnosis not present

## 2017-10-11 NOTE — Telephone Encounter (Signed)
Pt in ER now.

## 2017-10-11 NOTE — ED Triage Notes (Signed)
Fell at [facility], hematoma back of head. Patient states remembers fall, no dizziness or chest pain prior, no LOC, states fell in kitchen area. States is only minimally painful. No bleeding noted. Denies use of blood thinners other than asa.

## 2017-10-11 NOTE — ED Provider Notes (Signed)
[facility] Emergency Department Provider Note  ____________________________________________   I have reviewed the triage vital signs and the nursing notes. Where available I have reviewed prior notes and, if possible and indicated, outside hospital notes.    HISTORY  Chief Complaint Fall    HPI Paige Farmer is a 84 y.o. female aspirin, states she tripped in her kitchen, bumped her head did not pass out, non-syncopal fall feels just fine no significant headache no other complaints.  Happened just prior to arrival, no other alleviating or aggravating symptoms no antecedent treatment. Has hip pain or other injury.   Past Medical History:  Diagnosis Date  . Cancer ([place])    Thyroid  . Dementia   . Depression   . Glaucoma   . H/O: depression   . History of chicken pox   . Palpitations   . Thyroid cancer ([place])   . Thyroid disease     Patient Active Problem List   Diagnosis Date Noted  . Memory change 04/20/2016  . Elevated blood pressure reading 04/20/2016  . Hyperglycemia 10/03/2015  . Osteopenia 07/18/2015  . Disease of thyroid gland 07/18/2015  . Loss of weight 04/16/2015  . Health care maintenance 01/13/2015  . Beat, premature ventricular 09/11/2014  . Abdominal cramping 08/09/2014  . Acute diarrhea 08/09/2014  . Abnormal liver enzymes 08/09/2014  . Lump in neck 05/19/2014  . Localized swelling, mass, and lump of head 05/19/2014  . Abnormal liver function tests 05/14/2014  . Edema 05/14/2014  . Abnormal blood chemistry 05/14/2014  . Hypercholesterolemia 03/14/2014  . Pure hypercholesterolemia 03/14/2014  . Constipation 01/04/2014  . Anemia 01/04/2014  . Palpitations 10/01/2013  . Glaucoma 10/01/2013  . Depression 10/01/2013  . Head pain 10/01/2013  . Right hip pain 10/01/2013  . Cephalalgia 10/01/2013  . Malignant neoplasm of thyroid gland ([place]) 10/01/2013  . Arthralgia of hip or thigh 10/01/2013  . Degenerative arthritis of hip  07/28/2012  . HZV (herpes zoster virus) post herpetic neuralgia 03/23/2012  . Fatigue 12/22/2011    Past Surgical History:  Procedure Laterality Date  . ABDOMINAL HYSTERECTOMY    . APPENDECTOMY    . LAPAROSCOPIC HYSTERECTOMY  1970   ovaries left in place  . THYROIDECTOMY    . TONSILLECTOMY  1952    Prior to Admission medications   Medication Sig Start Date End Date Taking? Authorizing Provider  aspirin EC 81 MG tablet Take 81 mg by mouth daily.    [provider]  busPIRone (BUSPAR) 10 MG tablet Take 10 mg by mouth 3 (three) times daily.    [provider]  donepezil (ARICEPT) 10 MG tablet Take 10 mg by mouth at bedtime.  08/04/16   [provider]  dorzolamide-timolol (COSOPT) 22.3-6.8 MG/ML ophthalmic solution INSTILL 1 DROP INTO BOTH EYES TWICE DAILY 01/04/17   Burchette, Alinda Sierras, MD  hydroxypropyl methylcellulose / hypromellose (ISOPTO TEARS / GONIOVISC) 2.5 % ophthalmic solution Place 2 drops into both eyes 2 (two) times daily.    [provider]  levothyroxine (SYNTHROID, LEVOTHROID) 75 MCG tablet TAKE 1 TABLET (75 MCG TOTAL) BY MOUTH DAILY. 08/19/16   Einar Pheasant, MD  liothyronine (CYTOMEL) 5 MCG tablet TAKE 1.5 TABLETS (7.5 MCG TOTAL) BY MOUTH DAILY. 08/19/16   Einar Pheasant, MD  loperamide (IMODIUM A-D) 2 MG tablet Take 2 mg by mouth 4 (four) times daily as needed for diarrhea or loose stools.    [provider]  Melatonin 3 MG CAPS Take 1 capsule by  mouth Nightly.     [provider]  Multiple Vitamins-Minerals (CENTRUM) tablet Take 1 tablet by mouth daily.    [provider]  Multiple Vitamins-Minerals (PRESERVISION/LUTEIN) CAPS Take by mouth 2 (two) times daily.    [provider]  naproxen sodium (ALEVE) 220 MG tablet Take 220 mg by mouth every 8 (eight) hours.    [provider]  sertraline (ZOLOFT) 100 MG tablet Take 1 tablet (100 mg total) by mouth daily. Please place in blister packs  08/19/16   Einar Pheasant, MD    Allergies Doxepin  Family History  Problem Relation Age of Onset  . Cancer Mother        multiple myeloma  . Parkinson's disease Father   . Cancer - Lung Brother        x 2  . Breast cancer Neg Hx     Social History Social History   Tobacco Use  . Smoking status: Never Smoker  . Smokeless tobacco: Never Used  Substance Use Topics  . Alcohol use: No    Alcohol/week: 0.0 oz  . Drug use: No    Review of Systems Constitutional: No fever/chills Eyes: No visual changes. ENT: No sore throat. No stiff neck no neck pain Cardiovascular: Denies chest pain. Respiratory: Denies shortness of breath. Gastrointestinal:   no vomiting.  No diarrhea.  No constipation. Genitourinary: Negative for dysuria. Musculoskeletal: Negative lower extremity swelling Skin: Negative for rash. Neurological: Negative for severe headaches, focal weakness or numbness.   ____________________________________________   PHYSICAL EXAM:  VITAL SIGNS: ED Triage Vitals  Enc Vitals Group     BP 10/11/17 1410 (!) 164/61     Pulse Rate 10/11/17 1410 60     Resp 10/11/17 1410 20     Temp 10/11/17 1410 97.8 F (36.6 C)     Temp Source 10/11/17 1410 Oral     SpO2 10/11/17 1410 99 %     Weight 10/11/17 1411 112 lb (50.8 kg)     Height 10/11/17 1411 '5\' 2"'$  (1.575 m)     Head Circumference --      Peak Flow --      Pain Score 10/11/17 1411 3     Pain Loc --      Pain Edu? --      Excl. in [place]? --     Constitutional: Alert and oriented. Well appearing and in no acute distress. Eyes: Conjunctivae are normal Head: Small bruise to the occiput no bleeding or skull fracture appreciated HEENT: No congestion/rhinnorhea. Mucous membranes are moist.  Oropharynx non-erythematous Neck:   Nontender with no meningismus, no masses, no stridor Cardiovascular: Normal rate, regular rhythm. Grossly normal heart sounds.  Good peripheral circulation. Respiratory: Normal respiratory  effort.  No retractions. Lungs CTAB. Abdominal: Soft and nontender. No distention. No guarding no rebound Back:  There is no focal tenderness or step off.  there is no midline tenderness there are no lesions noted. there is no CVA tenderness Musculoskeletal: No lower extremity tenderness, no upper extremity tenderness. No joint effusions, no DVT signs strong distal pulses no edema Neurologic:  Normal speech and language. No gross focal neurologic deficits are appreciated.  Skin:  Skin is warm, dry and intact. No rash noted. Psychiatric: Mood and affect are normal. Speech and behavior are normal.  ____________________________________________   LABS (all labs ordered are listed, but only abnormal results are displayed)  Labs Reviewed - No data to display  Pertinent labs  results that were available during my  care of the patient were reviewed by me and considered in my medical decision making (see chart for details). ____________________________________________  EKG  I personally interpreted any EKGs ordered by me or triage  ____________________________________________  RADIOLOGY  Pertinent labs & imaging results that were available during my care of the patient were reviewed by me and considered in my medical decision making (see chart for details). If possible, patient and/or family made aware of any abnormal findings.  No results found. ____________________________________________    PROCEDURES  Procedure(s) performed: None  Procedures  Critical Care performed: None  ____________________________________________   INITIAL IMPRESSION / ASSESSMENT AND PLAN / ED COURSE  Pertinent labs & imaging results that were available during my care of the patient were reviewed by me and considered in my medical decision making (see chart for details).  Patient here with a non-syncopal fall alert and oriented no acute distress no significant injury noted however given that she is on  aspirin and her age we will do a CT scan, if that is negative I think she can go home.    ____________________________________________   FINAL CLINICAL IMPRESSION(S) / ED DIAGNOSES  Final diagnoses:  None      This chart was dictated using voice recognition software.  Despite best efforts to proofread,  errors can occur which can change meaning.      Schuyler Amor, MD 10/11/17 1430

## 2017-10-11 NOTE — Telephone Encounter (Unsigned)
Copied from [place] [id number]. Topic: Quick Communication - See Telephone Encounter >> Oct 11, 2017  1:21 PM Percell Belt A wrote: CRM for notification. See Telephone encounter for: 10/11/17. [facility] called in and stated pt had a fall today .  Pt has  knot on the back of her head.  They called EMS and pt is going to [place] to be checked out   [place]

## 2017-10-11 NOTE — Telephone Encounter (Signed)
FYI

## 2017-10-15 ENCOUNTER — Telehealth: Payer: Self-pay | Admitting: Internal Medicine

## 2017-10-15 NOTE — Telephone Encounter (Signed)
Copied from [place] [id number]. Topic: Inquiry >> Oct 15, 2017 12:54 PM Oliver Pila B wrote: Reason for CRM: [place] from [facility] called to state the pt switched from donepezil (ARICEPT) 10 MG tablet [[id number]] to Exelon to due to frequent diarrhea; but the Exelon is having a worse effect keeping pt up during the night and has caused 8 loose bowel by 1pm today only, contact [place] @ [phone]

## 2017-10-18 NOTE — Telephone Encounter (Signed)
Called [place] to get more info. She stated she meant to call her GI doctor. Advised her to call back if needed.

## 2017-11-03 ENCOUNTER — Encounter: Payer: Self-pay | Admitting: Internal Medicine

## 2017-11-03 ENCOUNTER — Ambulatory Visit: Payer: Medicare Other | Admitting: Internal Medicine

## 2017-11-03 VITALS — BP 138/78 | HR 58 | Temp 97.6°F | Resp 18 | Wt 114.8 lb

## 2017-11-03 DIAGNOSIS — R413 Other amnesia: Secondary | ICD-10-CM | POA: Diagnosis not present

## 2017-11-03 DIAGNOSIS — R030 Elevated blood-pressure reading, without diagnosis of hypertension: Secondary | ICD-10-CM | POA: Diagnosis not present

## 2017-11-03 DIAGNOSIS — C73 Malignant neoplasm of thyroid gland: Secondary | ICD-10-CM

## 2017-11-03 DIAGNOSIS — D649 Anemia, unspecified: Secondary | ICD-10-CM

## 2017-11-03 DIAGNOSIS — R739 Hyperglycemia, unspecified: Secondary | ICD-10-CM | POA: Diagnosis not present

## 2017-11-03 DIAGNOSIS — E7800 Pure hypercholesterolemia, unspecified: Secondary | ICD-10-CM | POA: Diagnosis not present

## 2017-11-03 DIAGNOSIS — F329 Major depressive disorder, single episode, unspecified: Secondary | ICD-10-CM

## 2017-11-03 DIAGNOSIS — F32A Depression, unspecified: Secondary | ICD-10-CM

## 2017-11-03 DIAGNOSIS — R748 Abnormal levels of other serum enzymes: Secondary | ICD-10-CM | POA: Diagnosis not present

## 2017-11-03 DIAGNOSIS — L989 Disorder of the skin and subcutaneous tissue, unspecified: Secondary | ICD-10-CM | POA: Diagnosis not present

## 2017-11-03 MED ORDER — MUPIROCIN 2 % EX OINT: TOPICAL_OINTMENT | CUTANEOUS | 0 refills | Status: DC

## 2017-11-03 NOTE — Progress Notes (Signed)
Patient ID: Paige Farmer, female   DOB: Nov 03, 1930, 84 y.o.   MRN: [id number]   Subjective:    Patient ID: Paige Farmer, female    DOB: 10-27-1930, 84 y.o.   MRN: [id number]  HPI  Patient here for a scheduled follow up.  She is accompanied by her son.  History obtained from both of them.  Reports that diarrhea resolved since stopping aricept.  Tries to stay active.  No chest pain.  Breathing stable.  No acid reflux.  No abdominal pain.  Memory stable.  Just saw Dr Rogue Bussing 08/2017.  Stable.  Recommended f/u in one year.     Past Medical History:  Diagnosis Date  . Cancer ([place])    Thyroid  . Dementia   . Depression   . Glaucoma   . H/O: depression   . History of chicken pox   . Palpitations   . Thyroid cancer ([place])   . Thyroid disease    Past Surgical History:  Procedure Laterality Date  . ABDOMINAL HYSTERECTOMY    . APPENDECTOMY    . LAPAROSCOPIC HYSTERECTOMY  1970   ovaries left in place  . THYROIDECTOMY    . TONSILLECTOMY  1952   Family History  Problem Relation Age of Onset  . Cancer Mother        multiple myeloma  . Parkinson's disease Father   . Cancer - Lung Brother        x 2  . Breast cancer Neg Hx    Social History   Socioeconomic History  . Marital status: Widowed    Spouse name: Not on file  . Number of children: Not on file  . Years of education: Not on file  . Highest education level: Not on file  Occupational History  . Not on file  Social Needs  . Financial resource strain: Not on file  . Food insecurity:    Worry: Not on file    Inability: Not on file  . Transportation needs:    Medical: Not on file    Non-medical: Not on file  Tobacco Use  . Smoking status: Never Smoker  . Smokeless tobacco: Never Used  Substance and Sexual Activity  . Alcohol use: No    Alcohol/week: 0.0 oz  . Drug use: No  . Sexual activity: Not on file  Lifestyle  . Physical activity:    Days per week: Not on file    Minutes per session: Not on file  .  Stress: Not on file  Relationships  . Social connections:    Talks on phone: Not on file    Gets together: Not on file    Attends religious service: Not on file    Active member of club or organization: Not on file    Attends meetings of clubs or organizations: Not on file    Relationship status: Not on file  Other Topics Concern  . Not on file  Social History Narrative  . Not on file    Outpatient Encounter Medications as of 11/03/2017  Medication Sig  . aspirin EC 81 MG tablet Take 81 mg by mouth daily.  . busPIRone (BUSPAR) 10 MG tablet Take 10 mg by mouth 3 (three) times daily.  . dorzolamide-timolol (COSOPT) 22.3-6.8 MG/ML ophthalmic solution INSTILL 1 DROP INTO BOTH EYES TWICE DAILY  . hydroxypropyl methylcellulose / hypromellose (ISOPTO TEARS / GONIOVISC) 2.5 % ophthalmic solution Place 2 drops into both eyes 2 (two) times daily.  Marland Kitchen levothyroxine (SYNTHROID, LEVOTHROID)  75 MCG tablet TAKE 1 TABLET (75 MCG TOTAL) BY MOUTH DAILY.  Marland Kitchen liothyronine (CYTOMEL) 5 MCG tablet TAKE 1.5 TABLETS (7.5 MCG TOTAL) BY MOUTH DAILY.  Marland Kitchen loperamide (IMODIUM A-D) 2 MG tablet Take 2 mg by mouth 4 (four) times daily as needed for diarrhea or loose stools.  . Melatonin 3 MG CAPS Take 1 capsule by mouth Nightly.   . Multiple Vitamins-Minerals (CENTRUM) tablet Take 1 tablet by mouth daily.  . Multiple Vitamins-Minerals (PRESERVISION/LUTEIN) CAPS Take by mouth 2 (two) times daily.  . mupirocin ointment (BACTROBAN) 2 % Apply to affected area bid x 1 week.  . naproxen sodium (ALEVE) 220 MG tablet Take 220 mg by mouth every 8 (eight) hours.  . sertraline (ZOLOFT) 100 MG tablet Take 1 tablet (100 mg total) by mouth daily. Please place in blister packs  . [DISCONTINUED] donepezil (ARICEPT) 10 MG tablet Take 10 mg by mouth at bedtime.    No facility-administered encounter medications on file as of 11/03/2017.     Review of Systems  Constitutional: Negative for appetite change and unexpected weight change.    HENT: Negative for congestion and sinus pressure.   Respiratory: Negative for cough, chest tightness and shortness of breath.   Cardiovascular: Negative for chest pain, palpitations and leg swelling.  Gastrointestinal: Negative for abdominal pain, nausea and vomiting.       Diarrhea resolved.    Genitourinary: Negative for difficulty urinating and dysuria.  Musculoskeletal: Negative for joint swelling and myalgias.  Skin: Negative for color change and rash.  Neurological: Negative for dizziness, light-headedness and headaches.  Psychiatric/Behavioral: Negative for agitation and dysphoric mood.       Objective:     Blood pressure rechecked by me:  138/78  Physical Exam  Constitutional: She appears well-developed and well-nourished. No distress.  HENT:  Nose: Nose normal.  Mouth/Throat: Oropharynx is clear and moist.  Neck: Neck supple. No thyromegaly present.  Cardiovascular: Normal rate and regular rhythm.  Pulmonary/Chest: Breath sounds normal. No respiratory distress. She has no wheezes.  Abdominal: Soft. Bowel sounds are normal. There is no tenderness.  Musculoskeletal: She exhibits no edema or tenderness.  Lymphadenopathy:    She has no cervical adenopathy.  Skin: No rash noted. No erythema.  Psychiatric: She has a normal mood and affect. Her behavior is normal.    BP 138/78   Pulse (!) 58   Temp 97.6 F (36.4 C) (Oral)   Resp 18   Wt 114 lb 12.8 oz (52.1 kg)   SpO2 96%   BMI 21.00 kg/m  Wt Readings from Last 3 Encounters:  11/03/17 114 lb 12.8 oz (52.1 kg)  10/11/17 112 lb (50.8 kg)  08/31/17 114 lb 10.2 oz (52 kg)     Lab Results  Component Value Date   WBC 6.9 08/31/2017   HGB 12.3 08/31/2017   HCT 36.7 08/31/2017   PLT 260 08/31/2017   GLUCOSE 68 08/31/2017   CHOL 228 (H) 01/31/2016   TRIG 69.0 01/31/2016   HDL 64.50 01/31/2016   LDLCALC 149 (H) 01/31/2016   ALT 15 08/31/2017   AST 19 08/31/2017   NA 138 08/31/2017   K 3.7 08/31/2017   CL 103  08/31/2017   CREATININE 1.00 08/31/2017   BUN 19 08/31/2017   CO2 29 08/31/2017   TSH 2.830 08/31/2017   HGBA1C 5.9 01/31/2016    Ct Head Wo Contrast  Result Date: 10/11/2017 CLINICAL DATA:  84 year old female post fall hitting back of head. No loss of consciousness.  Initial encounter. EXAM: CT HEAD WITHOUT CONTRAST TECHNIQUE: Contiguous axial images were obtained from the base of the skull through the vertex without intravenous contrast. COMPARISON:  04/22/2017 CT. FINDINGS: Brain: No intracranial hemorrhage or CT evidence of large acute infarct. Mild chronic microvascular changes. Mild global atrophy. Ossification of falx incidentally noted. No intracranial mass lesion noted on this unenhanced exam. Vascular: Vascular calcifications Skull: No skull fracture. Sinuses/Orbits: Visualized orbital structures unremarkable. Visualized mastoid air cells, middle ear cavities and paranasal sinuses are clear. Other: Minimal left parietal scalp hematoma. IMPRESSION: Minimal left parietal scalp hematoma without underlying fracture or intracranial hemorrhage. Electronically Signed   By: Genia Del M.D.   On: 10/11/2017 14:56       Assessment & Plan:   Problem List Items Addressed This Visit    Abnormal liver enzymes    Follow liver panel.       Anemia    Follow cbc.       Depression    Doing well on zoloft and buspar.  Follow.  Continue.        Elevated blood pressure reading    Recheck improved.  Follow.        Hypercholesterolemia    Follow lipid panel.       Hyperglycemia    Low carb diet and exercise.  Follow met b and a1c.        Malignant neoplasm of thyroid gland ([place])    S/p RAI U (2007).  Currently on synthroid and cytomel.  Stable.  Recommended f/u q year.        Memory change    Has been diagnosed with mixed dementia.  Off aricept now.  Diarrhea resolved.  Continue f/u with neurology.         Other Visit Diagnoses    Leg lesion    -  Primary   Apply bactroban  bid.  notify me if does not completely resolve.         Einar Pheasant, MD

## 2017-11-03 NOTE — Patient Instructions (Signed)
Apply bactroban to affected area on leg twice a day for one week.

## 2017-11-14 ENCOUNTER — Encounter: Payer: Self-pay | Admitting: Internal Medicine

## 2017-11-14 NOTE — Assessment & Plan Note (Signed)
Low carb diet and exercise.  Follow met b and a1c.   

## 2017-11-14 NOTE — Assessment & Plan Note (Signed)
Follow liver panel.  

## 2017-11-14 NOTE — Assessment & Plan Note (Signed)
Doing well on zoloft and buspar.  Follow.  Continue.

## 2017-11-14 NOTE — Assessment & Plan Note (Signed)
Has been diagnosed with mixed dementia.  Off aricept now.  Diarrhea resolved.  Continue f/u with neurology.

## 2017-11-14 NOTE — Assessment & Plan Note (Signed)
Recheck improved.  Follow.   

## 2017-11-14 NOTE — Assessment & Plan Note (Signed)
Follow lipid panel.   

## 2017-11-14 NOTE — Assessment & Plan Note (Signed)
Follow cbc.  

## 2017-11-14 NOTE — Assessment & Plan Note (Signed)
S/p RAI U (2007).  Currently on synthroid and cytomel.  Stable.  Recommended f/u q year.

## 2017-11-25 ENCOUNTER — Telehealth: Payer: Self-pay | Admitting: Internal Medicine

## 2017-11-25 NOTE — Telephone Encounter (Signed)
Ok to change to night, but needs to not be given with any other medication or food.  Give at least 3-4 hours after evening meal.

## 2017-11-25 NOTE — Telephone Encounter (Signed)
Copied from [place] [id number]. Topic: General - Other >> Nov 25, 2017 12:32 PM Yvette Rack wrote: Reason for CRM: Tye a med tech at [facility] [phone] states that the pt family would to change from taking the levothyroxine (SYNTHROID, LEVOTHROID) 75 MCG tablet in the morning to taking it at night please fax to [phone]

## 2017-11-25 NOTE — Telephone Encounter (Signed)
Would like to know why they want to change, because thyroid medication is given by itself with no food and no other medications.  Usually given in the am - prior to eating, etc,

## 2017-11-25 NOTE — Telephone Encounter (Signed)
OK for patient to take at night?

## 2017-11-25 NOTE — Telephone Encounter (Signed)
Please advise 

## 2017-11-25 NOTE — Telephone Encounter (Signed)
Patient is not wanting to take medication so early in the morning because they have to wake her up and then she has to wait so long to eat once she is already up

## 2017-11-26 NOTE — Telephone Encounter (Signed)
Spoke with [employer] and gave orders for patient to take medication at 8 pm. She gets other pills at 9:00 so she cannot take it with them

## 2017-11-26 NOTE — Telephone Encounter (Signed)
Noted.  Do I need to do anything with this?  If no, ok to close note.

## 2017-12-20 ENCOUNTER — Telehealth: Payer: Self-pay | Admitting: Internal Medicine

## 2017-12-20 NOTE — Telephone Encounter (Signed)
Copied from [place] [id number]. Topic: Quick Communication - See Telephone Encounter >> Dec 20, 2017 11:43 AM Mylinda Latina, NT wrote: CRM for notification. See Telephone encounter for: 12/20/17. Ty calling from [facility] living  called and states that the patient fell and scraped her right knee. She states she is fine and that this is protocol to contact the PCP to let them know. If you have any questions please CB# [phone]

## 2017-12-20 NOTE — Telephone Encounter (Signed)
FYI

## 2017-12-20 NOTE — Telephone Encounter (Signed)
Called [facility] regarding multiple patients. Nurses were in a meeting.

## 2017-12-22 NOTE — Telephone Encounter (Signed)
Reviewed messages.  I need more information about the fall and if any residual injuries or problems from the fall.  Did she hit her head.  Also, need to know why u/a needed.

## 2017-12-22 NOTE — Telephone Encounter (Signed)
Received fax from [facility] requesting UA for patient. Called to get more info. Med tech stated she did not really know what was going on. All she knew was that she seemed more "snippy." There had been no vitals done on pt so advised them to get more info about pts sx, vitals, etc since she did have a fall 2 days ago and call back,.

## 2017-12-22 NOTE — Telephone Encounter (Signed)
Spoke with christian at [facility]. Patient did not hit head when she fell and he noted no acute symptoms when he assessed. The only thing different about her that he noticed was she was a little more agitated and would not let him get her vitals signs. Advised him to continue monitor closely and let us know if any changes. Also told them that we would be closed tomorrow so if patient develops any new or worsening symptoms she should be evaluated,.

## 2017-12-24 NOTE — Telephone Encounter (Signed)
Spoke with [place] at [facility]. Confirmed patient is doing okay. No urine was collected and is not needed. Patient is not having any symptoms. He did not feel the urine was needed on Wednesday when he assessed her.

## 2017-12-24 NOTE — Telephone Encounter (Signed)
Attempted to call [facility], no answer. Will try again

## 2017-12-24 NOTE — Telephone Encounter (Signed)
Call and check on pt.  Not sure if urine order was given.  If doing ok, may not need.  Just need update on how she is doing.

## 2018-03-07 LAB — HM MAMMOGRAPHY

## 2018-03-14 ENCOUNTER — Telehealth: Payer: Self-pay

## 2018-03-14 NOTE — Telephone Encounter (Signed)
Called [facility] to have them fax over form that needs to be completed.

## 2018-03-14 NOTE — Telephone Encounter (Signed)
Copied from [place] [id number]. Topic: Inquiry >> Mar 11, 2018  3:38 PM Oliver Pila B wrote: Reason for CRM: Reason for CRM: [facility] is needing the fax docs that will allow the pt to have the flu shot at the facility returned by Monday if not pt will  have to get flu shot somewhere else; contact to advise @ [phone]

## 2018-03-15 NOTE — Telephone Encounter (Signed)
Spoke with Med Tech at facility to have form faxed. This is second attempt

## 2018-04-24 ENCOUNTER — Encounter: Payer: Self-pay | Admitting: Internal Medicine

## 2018-05-11 ENCOUNTER — Encounter: Payer: Self-pay | Admitting: Internal Medicine

## 2018-05-11 ENCOUNTER — Ambulatory Visit (INDEPENDENT_AMBULATORY_CARE_PROVIDER_SITE_OTHER): Payer: Medicare Other | Admitting: Internal Medicine

## 2018-05-11 DIAGNOSIS — F329 Major depressive disorder, single episode, unspecified: Secondary | ICD-10-CM | POA: Diagnosis not present

## 2018-05-11 DIAGNOSIS — C73 Malignant neoplasm of thyroid gland: Secondary | ICD-10-CM

## 2018-05-11 DIAGNOSIS — E7800 Pure hypercholesterolemia, unspecified: Secondary | ICD-10-CM

## 2018-05-11 DIAGNOSIS — R030 Elevated blood-pressure reading, without diagnosis of hypertension: Secondary | ICD-10-CM

## 2018-05-11 DIAGNOSIS — F32A Depression, unspecified: Secondary | ICD-10-CM

## 2018-05-11 DIAGNOSIS — R739 Hyperglycemia, unspecified: Secondary | ICD-10-CM

## 2018-05-11 DIAGNOSIS — D649 Anemia, unspecified: Secondary | ICD-10-CM

## 2018-05-11 NOTE — Progress Notes (Signed)
Patient ID: Paige Farmer, female   DOB: Nov 29, 1930, 84 y.o.   MRN: [id number]   Subjective:    Patient ID: Paige Farmer, female    DOB: May 27, 1931, 84 y.o.   MRN: [id number]  HPI  Patient here for a scheduled follow up.  She is accompanied by her son.  History obtained from both of them.  She is currently doing well.  Enjoying where she is living.  Eating well.  No diarrhea since stopping aricept.  Has seen neurology.  No chest pain.  No sob.  No acid reflux.  No abdominal pain.  Bowels moving.  Overall feels good.     Past Medical History:  Diagnosis Date  . Cancer ([place])    Thyroid  . Dementia ([place])   . Depression   . Glaucoma   . H/O: depression   . History of chicken pox   . Palpitations   . Thyroid cancer ([place])   . Thyroid disease    Past Surgical History:  Procedure Laterality Date  . ABDOMINAL HYSTERECTOMY    . APPENDECTOMY    . LAPAROSCOPIC HYSTERECTOMY  1970   ovaries left in place  . THYROIDECTOMY    . TONSILLECTOMY  1952   Family History  Problem Relation Age of Onset  . Cancer Mother        multiple myeloma  . Parkinson's disease Father   . Cancer - Lung Brother        x 2  . Breast cancer Neg Hx    Social History   Socioeconomic History  . Marital status: Widowed    Spouse name: Not on file  . Number of children: Not on file  . Years of education: Not on file  . Highest education level: Not on file  Occupational History  . Not on file  Social Needs  . Financial resource strain: Not on file  . Food insecurity:    Worry: Not on file    Inability: Not on file  . Transportation needs:    Medical: Not on file    Non-medical: Not on file  Tobacco Use  . Smoking status: Never Smoker  . Smokeless tobacco: Never Used  Substance and Sexual Activity  . Alcohol use: No    Alcohol/week: 0.0 standard drinks  . Drug use: No  . Sexual activity: Not on file  Lifestyle  . Physical activity:    Days per week: Not on file    Minutes per session: Not on  file  . Stress: Not on file  Relationships  . Social connections:    Talks on phone: Not on file    Gets together: Not on file    Attends religious service: Not on file    Active member of club or organization: Not on file    Attends meetings of clubs or organizations: Not on file    Relationship status: Not on file  Other Topics Concern  . Not on file  Social History Narrative  . Not on file    Outpatient Encounter Medications as of 05/11/2018  Medication Sig  . aspirin EC 81 MG tablet Take 81 mg by mouth daily.  . busPIRone (BUSPAR) 10 MG tablet Take 10 mg by mouth 3 (three) times daily.  . dorzolamide-timolol (COSOPT) 22.3-6.8 MG/ML ophthalmic solution INSTILL 1 DROP INTO BOTH EYES TWICE DAILY  . hydroxypropyl methylcellulose / hypromellose (ISOPTO TEARS / GONIOVISC) 2.5 % ophthalmic solution Place 2 drops into both eyes 2 (two) times daily.  Marland Kitchen  levothyroxine (SYNTHROID, LEVOTHROID) 75 MCG tablet TAKE 1 TABLET (75 MCG TOTAL) BY MOUTH DAILY.  Marland Kitchen liothyronine (CYTOMEL) 5 MCG tablet TAKE 1.5 TABLETS (7.5 MCG TOTAL) BY MOUTH DAILY.  Marland Kitchen loperamide (IMODIUM A-D) 2 MG tablet Take 2 mg by mouth 4 (four) times daily as needed for diarrhea or loose stools.  . Melatonin 3 MG CAPS Take 1 capsule by mouth Nightly.   . Multiple Vitamins-Minerals (CENTRUM) tablet Take 1 tablet by mouth daily.  . Multiple Vitamins-Minerals (PRESERVISION/LUTEIN) CAPS Take by mouth 2 (two) times daily.  . mupirocin ointment (BACTROBAN) 2 % Apply to affected area bid x 1 week.  . naproxen sodium (ALEVE) 220 MG tablet Take 220 mg by mouth every 8 (eight) hours.  . sertraline (ZOLOFT) 100 MG tablet Take 1 tablet (100 mg total) by mouth daily. Please place in blister packs   No facility-administered encounter medications on file as of 05/11/2018.     Review of Systems  Constitutional: Negative for appetite change and unexpected weight change.  HENT: Negative for congestion and sinus pressure.   Respiratory: Negative  for cough, chest tightness and shortness of breath.   Cardiovascular: Negative for chest pain, palpitations and leg swelling.  Gastrointestinal: Negative for abdominal pain, diarrhea, nausea and vomiting.  Genitourinary: Negative for difficulty urinating and dysuria.  Musculoskeletal: Negative for joint swelling and myalgias.  Skin: Negative for color change and rash.  Neurological: Negative for dizziness, light-headedness and headaches.  Psychiatric/Behavioral: Negative for agitation and dysphoric mood.       Objective:    Physical Exam  Constitutional: She appears well-developed and well-nourished. No distress.  HENT:  Nose: Nose normal.  Mouth/Throat: Oropharynx is clear and moist.  Neck: Neck supple. No thyromegaly present.  Cardiovascular: Normal rate and regular rhythm.  Pulmonary/Chest: Breath sounds normal. No respiratory distress. She has no wheezes.  Abdominal: Soft. Bowel sounds are normal. There is no tenderness.  Musculoskeletal: She exhibits no edema or tenderness.  Lymphadenopathy:    She has no cervical adenopathy.  Skin: No rash noted. No erythema.  Psychiatric: She has a normal mood and affect. Her behavior is normal.    BP (!) 146/84 (BP Location: Left Arm, Patient Position: Sitting, Cuff Size: Normal)   Pulse 71   Temp (!) 97.5 F (36.4 C) (Oral)   Resp 16   Wt 122 lb 3.2 oz (55.4 kg)   SpO2 96%   BMI 22.35 kg/m  Wt Readings from Last 3 Encounters:  05/11/18 122 lb 3.2 oz (55.4 kg)  11/03/17 114 lb 12.8 oz (52.1 kg)  10/11/17 112 lb (50.8 kg)     Lab Results  Component Value Date   WBC 6.9 08/31/2017   HGB 12.3 08/31/2017   HCT 36.7 08/31/2017   PLT 260 08/31/2017   GLUCOSE 68 08/31/2017   CHOL 228 (H) 01/31/2016   TRIG 69.0 01/31/2016   HDL 64.50 01/31/2016   LDLCALC 149 (H) 01/31/2016   ALT 15 08/31/2017   AST 19 08/31/2017   NA 138 08/31/2017   K 3.7 08/31/2017   CL 103 08/31/2017   CREATININE 1.00 08/31/2017   BUN 19 08/31/2017    CO2 29 08/31/2017   TSH 2.830 08/31/2017   HGBA1C 5.9 01/31/2016    Ct Head Wo Contrast  Result Date: 10/11/2017 CLINICAL DATA:  84 year old female post fall hitting back of head. No loss of consciousness. Initial encounter. EXAM: CT HEAD WITHOUT CONTRAST TECHNIQUE: Contiguous axial images were obtained from the base of the skull through the  vertex without intravenous contrast. COMPARISON:  04/22/2017 CT. FINDINGS: Brain: No intracranial hemorrhage or CT evidence of large acute infarct. Mild chronic microvascular changes. Mild global atrophy. Ossification of falx incidentally noted. No intracranial mass lesion noted on this unenhanced exam. Vascular: Vascular calcifications Skull: No skull fracture. Sinuses/Orbits: Visualized orbital structures unremarkable. Visualized mastoid air cells, middle ear cavities and paranasal sinuses are clear. Other: Minimal left parietal scalp hematoma. IMPRESSION: Minimal left parietal scalp hematoma without underlying fracture or intracranial hemorrhage. Electronically Signed   By: Genia Del M.D.   On: 10/11/2017 14:56       Assessment & Plan:   Problem List Items Addressed This Visit    Anemia    Follow cbc.        Depression    Doing well on current regimen.  Follow.        Elevated blood pressure reading    Recheck improved.  Follow.        Hypercholesterolemia    Follow lipid panel.       Relevant Orders   Hepatic function panel   Lipid panel   Basic metabolic panel   Hyperglycemia    Follow met b and a1c.        Malignant neoplasm of thyroid gland [facility])    S/p RAI (2007).  On synthroid and cytomel.  Sees Dr Rogue Bussing.  Last evalauated 08/2017.  Recommended f/u in one year.            Einar Pheasant, MD

## 2018-05-15 ENCOUNTER — Encounter: Payer: Self-pay | Admitting: Internal Medicine

## 2018-05-15 NOTE — Assessment & Plan Note (Signed)
S/p RAI (2007).  On synthroid and cytomel.  Sees Dr Rogue Bussing.  Last evalauated 08/2017.  Recommended f/u in one year.

## 2018-05-15 NOTE — Assessment & Plan Note (Signed)
Follow met b and a1c.

## 2018-05-15 NOTE — Assessment & Plan Note (Signed)
Doing well on current regimen.  Follow.   

## 2018-05-15 NOTE — Assessment & Plan Note (Signed)
Follow cbc.  

## 2018-05-15 NOTE — Assessment & Plan Note (Signed)
Recheck improved.  Follow.   

## 2018-05-15 NOTE — Assessment & Plan Note (Signed)
Follow lipid panel.   

## 2018-06-08 ENCOUNTER — Other Ambulatory Visit: Payer: Medicare Other

## 2018-08-16 ENCOUNTER — Telehealth: Payer: Self-pay | Admitting: Internal Medicine

## 2018-08-16 NOTE — Telephone Encounter (Signed)
Lanelle Bal is requesting the order be faxed to [phone] ([facility])

## 2018-08-16 NOTE — Telephone Encounter (Signed)
Copied from [place] [id number]. Topic: Quick Communication - See Telephone Encounter >> Aug 16, 2018  3:56 PM Bea Graff, NT wrote: CRM for notification. See Telephone encounter for: 08/16/18. Lanelle Bal with [facility] calling to request an order for their mobile x-ray unit to come out ot do a x-ray of this pts back as she is complaining of back pain. CB#: [phone]. Fax#: [phone]

## 2018-08-16 NOTE — Telephone Encounter (Signed)
Confirmed no falls or any other acute symptoms. Order for x-ray given verbally and written by Dr. Derrel Nip

## 2018-08-17 NOTE — Telephone Encounter (Signed)
Faxed to [facility]

## 2018-08-30 ENCOUNTER — Inpatient Hospital Stay: Payer: Medicare Other | Attending: Internal Medicine

## 2018-08-30 ENCOUNTER — Ambulatory Visit: Payer: Medicare Other | Admitting: Internal Medicine

## 2018-08-30 ENCOUNTER — Other Ambulatory Visit: Payer: Medicare Other

## 2018-08-30 ENCOUNTER — Other Ambulatory Visit: Payer: Self-pay

## 2018-08-30 ENCOUNTER — Encounter: Payer: Self-pay | Admitting: Internal Medicine

## 2018-08-30 ENCOUNTER — Inpatient Hospital Stay (HOSPITAL_BASED_OUTPATIENT_CLINIC_OR_DEPARTMENT_OTHER): Payer: Medicare Other | Admitting: Internal Medicine

## 2018-08-30 VITALS — BP 163/75 | HR 63 | Temp 98.1°F | Resp 18 | Wt 123.0 lb

## 2018-08-30 DIAGNOSIS — G309 Alzheimer's disease, unspecified: Secondary | ICD-10-CM

## 2018-08-30 DIAGNOSIS — Z85820 Personal history of malignant melanoma of skin: Secondary | ICD-10-CM | POA: Insufficient documentation

## 2018-08-30 DIAGNOSIS — Z801 Family history of malignant neoplasm of trachea, bronchus and lung: Secondary | ICD-10-CM | POA: Diagnosis not present

## 2018-08-30 DIAGNOSIS — Z807 Family history of other malignant neoplasms of lymphoid, hematopoietic and related tissues: Secondary | ICD-10-CM

## 2018-08-30 DIAGNOSIS — E890 Postprocedural hypothyroidism: Secondary | ICD-10-CM | POA: Insufficient documentation

## 2018-08-30 DIAGNOSIS — F0280 Dementia in other diseases classified elsewhere without behavioral disturbance: Secondary | ICD-10-CM | POA: Diagnosis not present

## 2018-08-30 DIAGNOSIS — C73 Malignant neoplasm of thyroid gland: Secondary | ICD-10-CM

## 2018-08-30 LAB — COMPREHENSIVE METABOLIC PANEL
ALBUMIN: 4.1 g/dL (ref 3.5–5.0)
ALT: 15 U/L (ref 0–44)
ANION GAP: 8 (ref 5–15)
AST: 17 U/L (ref 15–41)
Alkaline Phosphatase: 80 U/L (ref 38–126)
BILIRUBIN TOTAL: 0.7 mg/dL (ref 0.3–1.2)
BUN: 18 mg/dL (ref 8–23)
CO2: 27 mmol/L (ref 22–32)
Calcium: 9 mg/dL (ref 8.9–10.3)
Chloride: 101 mmol/L (ref 98–111)
Creatinine, Ser: 0.87 mg/dL (ref 0.44–1.00)
GFR calc Af Amer: >60 mL/min (ref >60)
Glucose, Bld: 107 mg/dL — ABNORMAL HIGH (ref 70–99)
POTASSIUM: 3.8 mmol/L (ref 3.5–5.1)
Sodium: 136 mmol/L (ref 135–145)
TOTAL PROTEIN: 6.9 g/dL (ref 6.5–8.1)

## 2018-08-30 LAB — CBC WITH DIFFERENTIAL/PLATELET
ABS IMMATURE GRANULOCYTES: 0.02 10*3/uL (ref 0.00–0.07)
BASOS ABS: 0.1 10*3/uL (ref 0.0–0.1)
BASOS PCT: 1 %
Eosinophils Absolute: 0.1 10*3/uL (ref 0.0–0.5)
Eosinophils Relative: 1 %
HCT: 37.6 % (ref 36.0–46.0)
HEMOGLOBIN: 12.3 g/dL (ref 12.0–15.0)
IMMATURE GRANULOCYTES: 0 %
LYMPHS PCT: 32 %
Lymphs Abs: 3 10*3/uL (ref 0.7–4.0)
MCH: 29.7 pg (ref 26.0–34.0)
MCHC: 32.7 g/dL (ref 30.0–36.0)
MCV: 90.8 fL (ref 80.0–100.0)
Monocytes Absolute: 0.7 10*3/uL (ref 0.1–1.0)
Monocytes Relative: 7 %
NEUTROS ABS: 5.6 10*3/uL (ref 1.7–7.7)
NEUTROS PCT: 59 %
Platelets: 323 10*3/uL (ref 150–400)
RBC: 4.14 MIL/uL (ref 3.87–5.11)
RDW: 14.8 % (ref 11.5–15.5)
WBC: 9.5 10*3/uL (ref 4.0–10.5)
nRBC: 0 % (ref 0.0–0.2)

## 2018-08-30 NOTE — Assessment & Plan Note (Addendum)
#   Thyroid cancer T2 NX; status post RAI U [2007]- currently on Synthroid 14mcg/d + cytomel 5 mcg/d.  No evidence of recurrence.  Thyroglobulin is pending.  #Discussed that I would not recommend any imaging/aggressive surveillance given her dementia  #Dementia Alzheimer's/vascular-overall stable.   # DISPOSITION: will call with results # follow up in 12 months- CBC CMP; thyroid profile/ thyroglobulin   [pt preference].  Cc; Dr.Scott

## 2018-08-30 NOTE — Progress Notes (Signed)
[facility] OFFICE PROGRESS NOTE  Patient Care Team: Einar Pheasant, MD as PCP - General (Internal Medicine)   SUMMARY OF ONCOLOGIC HISTORY:  Oncology History   # NOV 3086- FOLLICULAR CARCINOMA [[id number] thyroid lobe T=4cm; ] s/p [partial thyroidectomy; s/p complete thyroidectomy dec 2007; dr.Vaught] s/p RAIU. Synthroid 130mg/d + cytomel 5 mc/d; CT Oct 2015- NEG  # AUG 2017 ? 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   Patient denies any lumps or bumps.  Appetite is fair.  No weight loss but no palpitations.  No significant weight loss.  No new shortness of breath or cough.  No bone pain.  She continues to live in assisted living.  Accompanied by her son.   Review of Systems  Constitutional: Negative for chills, diaphoresis, fever, malaise/fatigue and weight loss.  HENT: Negative for nosebleeds and sore throat.   Eyes: Negative for double vision.  Respiratory: Negative for cough, hemoptysis, sputum production, shortness of breath and wheezing.   Cardiovascular: Negative for chest pain, palpitations, orthopnea and leg swelling.  Gastrointestinal: Negative for abdominal pain, blood in stool, constipation, diarrhea, heartburn, melena, nausea and vomiting.  Genitourinary: Negative for dysuria, frequency and urgency.  Musculoskeletal: Negative for back pain and joint pain.  Skin: Negative.  Negative for itching and rash.  Neurological: Negative for dizziness, tingling, focal weakness, weakness and headaches.  Endo/Heme/Allergies: Does not bruise/bleed easily.  Psychiatric/Behavioral: Negative for depression. The patient is not nervous/anxious and does not have insomnia.      PAST MEDICAL HISTORY :  Past Medical History:  Diagnosis Date  . [place] ([facility]    Thyroid  . Dementia ([facility] [place]   . Depression   . Glaucoma   . H/O: depression   . History of chicken pox   . Palpitations   . Thyroid cancer ([facility]   . Thyroid disease     PAST SURGICAL HISTORY :   Past Surgical History:  Procedure Laterality Date  . ABDOMINAL HYSTERECTOMY    . APPENDECTOMY    . LAPAROSCOPIC HYSTERECTOMY  1970   ovaries left in place  . THYROIDECTOMY    . TONSILLECTOMY  1952    FAMILY HISTORY :   Family History  Problem Relation Age of Onset  . Cancer Mother        multiple myeloma  . Parkinson's disease Father   . Cancer - Lung Brother        x 2  . Breast cancer Neg Hx     SOCIAL HISTORY:   Social History   Tobacco Use  . Smoking status: Never Smoker  . Smokeless tobacco: Never Used  Substance Use Topics  . Alcohol use: No    Alcohol/week: 0.0 standard drinks  . Drug use: No    ALLERGIES:  is allergic to doxepin.  MEDICATIONS:  Current Outpatient Medications  Medication Sig Dispense Refill  . aspirin EC 81 MG tablet Take 81 mg by mouth daily.    . busPIRone (BUSPAR) 10 MG tablet Take 10 mg by mouth 3 (three) times daily.    . dorzolamide-timolol (COSOPT) 22.3-6.8 MG/ML ophthalmic solution INSTILL 1 DROP INTO BOTH EYES TWICE DAILY 10 mL 0  . hydroxypropyl methylcellulose / hypromellose (ISOPTO TEARS / GONIOVISC)  2.5 % ophthalmic solution Place 2 drops into both eyes 2 (two) times daily.    Marland Kitchen levothyroxine (SYNTHROID, LEVOTHROID) 75 MCG tablet TAKE 1 TABLET (75 MCG TOTAL) BY MOUTH DAILY. 30 tablet 11  . liothyronine (CYTOMEL) 5 MCG tablet TAKE 1.5 TABLETS (7.5 MCG TOTAL) BY MOUTH DAILY. 45 tablet 11  . loperamide (IMODIUM A-D) 2 MG tablet Take 2 mg by mouth 4 (four) times daily as needed for diarrhea or loose stools.    . Melatonin 3 MG CAPS Take 1 capsule by mouth Nightly.     . Multiple Vitamins-Minerals (CENTRUM) tablet Take 1 tablet by mouth daily.    .  Multiple Vitamins-Minerals (PRESERVISION/LUTEIN) CAPS Take by mouth 2 (two) times daily.    . mupirocin ointment (BACTROBAN) 2 % Apply to affected area bid x 1 week. 22 g 0  . naproxen sodium (ALEVE) 220 MG tablet Take 220 mg by mouth every 8 (eight) hours.    . sertraline (ZOLOFT) 100 MG tablet Take 1 tablet (100 mg total) by mouth daily. Please place in blister packs 30 tablet 6   No current facility-administered medications for this visit.     PHYSICAL EXAMINATION: ECOG PERFORMANCE STATUS: 0  BP (!) 163/75 (BP Location: Right Arm, Patient Position: Sitting)   Pulse 63   Temp 98.1 F (36.7 C) (Tympanic)   Resp 18   Wt 123 lb (55.8 kg)   BMI 22.50 kg/m   Filed Weights   08/30/18 1342  Weight: 123 lb (55.8 kg)    Physical Exam  Constitutional: She is oriented to person, place, and time and well-developed, well-nourished, and in no distress.  HENT:  Head: Normocephalic and atraumatic.  Mouth/Throat: Oropharynx is clear and moist. No oropharyngeal exudate.  Eyes: Pupils are equal, round, and reactive to light.  Neck: Normal range of motion. Neck supple.  Cardiovascular: Normal rate and regular rhythm.  Pulmonary/Chest: No respiratory distress. She has no wheezes.  Abdominal: Soft. Bowel sounds are normal. She exhibits no distension and no mass. There is no abdominal tenderness. There is no rebound and no guarding.  Musculoskeletal: Normal range of motion.        General: No tenderness or edema.  Neurological: She is alert and oriented to person, place, and time.  Skin: Skin is warm.  Psychiatric: Affect normal.     LABORATORY DATA:  I have reviewed the data as listed    Component Value Date/Time   NA 136 08/30/2018 1316   NA 139 02/26/2017   NA 141 08/08/2012 1012   K 3.8 08/30/2018 1316   K 3.8 08/08/2012 1012   CL 101 08/30/2018 1316   CL 104 08/08/2012 1012   CO2 27 08/30/2018 1316   CO2 32 08/08/2012 1012   GLUCOSE 107 (H) 08/30/2018 1316   GLUCOSE 93  08/08/2012 1012   BUN 18 08/30/2018 1316   BUN 15 02/26/2017   BUN 18 08/08/2012 1012   CREATININE 0.87 08/30/2018 1316   CREATININE 0.85 03/27/2014 1142   CALCIUM 9.0 08/30/2018 1316   CALCIUM 8.9 08/08/2012 1012   PROT 6.9 08/30/2018 1316   PROT 6.5 08/08/2012 1012   ALBUMIN 4.1 08/30/2018 1316   ALBUMIN 3.5 08/08/2012 1012   AST 17 08/30/2018 1316   AST 17 08/08/2012 1012   ALT 15 08/30/2018 1316   ALT 18 08/08/2012 1012   ALKPHOS 80 08/30/2018 1316   ALKPHOS 63 08/08/2012 1012   BILITOT 0.7 08/30/2018 1316   BILITOT 0.4 08/08/2012 1012   GFRNONAA >  60 08/30/2018 1316   GFRNONAA >60 03/27/2014 1142   GFRNONAA >60 08/08/2012 1012   GFRAA >60 08/30/2018 1316   GFRAA >60 03/27/2014 1142   GFRAA >60 08/08/2012 1012    No results found for: SPEP, UPEP  Lab Results  Component Value Date   WBC 9.5 08/30/2018   NEUTROABS 5.6 08/30/2018   HGB 12.3 08/30/2018   HCT 37.6 08/30/2018   MCV 90.8 08/30/2018   PLT 323 08/30/2018      Chemistry      Component Value Date/Time   NA 136 08/30/2018 1316   NA 139 02/26/2017   NA 141 08/08/2012 1012   K 3.8 08/30/2018 1316   K 3.8 08/08/2012 1012   CL 101 08/30/2018 1316   CL 104 08/08/2012 1012   CO2 27 08/30/2018 1316   CO2 32 08/08/2012 1012   BUN 18 08/30/2018 1316   BUN 15 02/26/2017   BUN 18 08/08/2012 1012   CREATININE 0.87 08/30/2018 1316   CREATININE 0.85 03/27/2014 1142   GLU 99 02/26/2017      Component Value Date/Time   CALCIUM 9.0 08/30/2018 1316   CALCIUM 8.9 08/08/2012 1012   ALKPHOS 80 08/30/2018 1316   ALKPHOS 63 08/08/2012 1012   AST 17 08/30/2018 1316   AST 17 08/08/2012 1012   ALT 15 08/30/2018 1316   ALT 18 08/08/2012 1012   BILITOT 0.7 08/30/2018 1316   BILITOT 0.4 08/08/2012 1012       ASSESSMENT & PLAN:   Malignant neoplasm of thyroid gland ([place]) # Thyroid cancer T2 NX; status post RAI U [2007]- currently on Synthroid 140mg/d + cytomel 5 mcg/d.  No evidence of recurrence.  Thyroglobulin  is pending.  #Discussed that I would not recommend any imaging/aggressive surveillance given her dementia  #Dementia Alzheimer's/vascular-overall stable.   # DISPOSITION: will call with results # follow up in 12 months- CBC CMP; thyroid profile/ thyroglobulin   [pt preference].  Cc; Dr.Scott     GCammie Sickle MD 08/30/2018 8:37 PM

## 2018-08-31 LAB — THYROID PANEL WITH TSH
Free Thyroxine Index: 1.5 (ref 1.2–4.9)
T3 Uptake Ratio: 24 % (ref 24–39)
T4 TOTAL: 6.1 ug/dL (ref 4.5–12.0)
TSH: 3.02 u[IU]/mL (ref 0.450–4.500)

## 2018-08-31 LAB — TGAB+THYROGLOBULIN IMA OR RIA: Thyroglobulin Antibody: <1 IU/mL (ref 0.0–0.9)

## 2018-08-31 LAB — THYROGLOBULIN BY IMA: Thyroglobulin by IMA: <0.1 ng/mL — ABNORMAL LOW (ref 1.5–38.5)

## 2018-10-26 ENCOUNTER — Telehealth: Payer: Self-pay

## 2018-10-26 NOTE — Telephone Encounter (Signed)
Copied from [place] [id number]. Topic: General - Other >> Oct 26, 2018 12:23 PM Nils Flack wrote: Reason for CRM: Mia from [facility] called - to see if fax for pt orders was received. It was faxed on Friday and Monday.  They would like to have this back today. Please call  Cb is [phone]

## 2018-10-26 NOTE — Telephone Encounter (Signed)
Son stated he did not request this and asked that I call and tell them to contact him. Mia is going to give him a call.

## 2018-11-11 ENCOUNTER — Telehealth: Payer: Self-pay | Admitting: Internal Medicine

## 2018-11-11 NOTE — Telephone Encounter (Signed)
See if we can do a virtual visit with pt.  I have some forms that need to be completed on her.  The facilities have been helping [place] with virtual visit.  I was not sure who to send this to - to see if the facility could arrange a virtual visit.  if not possible or is a problem, let me know.  Thanks

## 2018-11-11 NOTE — Telephone Encounter (Signed)
I called patients son, patient had an appt scheduled with you on Wednesday for a physical. Pt's son said she is in nursing home and can not leave. He said this appt has been rescheduled twice. He will call office to schedule appointment when pt can come out.

## 2018-11-16 ENCOUNTER — Encounter: Payer: Medicare Other | Admitting: Internal Medicine

## 2018-11-16 NOTE — Telephone Encounter (Signed)
Scheduled a Doxy for Friday at 1:30, sorry took a while to reach facility.

## 2018-11-16 NOTE — Telephone Encounter (Signed)
We had received some forms to complete. Do we have these?  Will need for her appt.

## 2018-11-17 NOTE — Telephone Encounter (Signed)
Paige [facility]) [phone] cancelled patient 11/18/2018 appointment with Dr. Nicki Reaper stating he was not made aware and patient has dementia. [name] requesting to speak with Dr. Nicki Reaper nurse today, please advise

## 2018-11-17 NOTE — Telephone Encounter (Signed)
Called  Patient son he ask could this please wait until he can be with his mother for visit due to she will not understand anything about this visit.

## 2018-11-17 NOTE — Telephone Encounter (Signed)
Ok

## 2018-11-18 ENCOUNTER — Ambulatory Visit: Payer: Medicare Other | Admitting: Internal Medicine

## 2018-11-18 NOTE — Telephone Encounter (Signed)
Noted  

## 2018-11-18 NOTE — Telephone Encounter (Signed)
Called pt son. He stated that he will call the office to make the appointment when mother is allowed out of home. He has no idea when this will be.

## 2018-11-18 NOTE — Telephone Encounter (Signed)
Do you mind scheduling patient out awhile? She is in a facility and cannot come out yet.

## 2019-03-08 ENCOUNTER — Telehealth: Payer: Self-pay | Admitting: Internal Medicine

## 2019-03-08 IMAGING — CR DG HAND COMPLETE 3+V*R*
1 series · 3 of 3 positions shown · non-contrast
Comparison: None.

CLINICAL DATA: Fall today- Right hand pain. Pain primarily at the
thumb and pointer finger. Lacerations at end of thumb and pointer
finger. Pt stated she fell (FOOSH) and landed on concrete No
previous injury

EXAM:
RIGHT HAND - COMPLETE 3+ VIEW

[Series 1: dg hand complete right · 0.14mm/px · 3 of 3 slices shown]
[im 1/3]
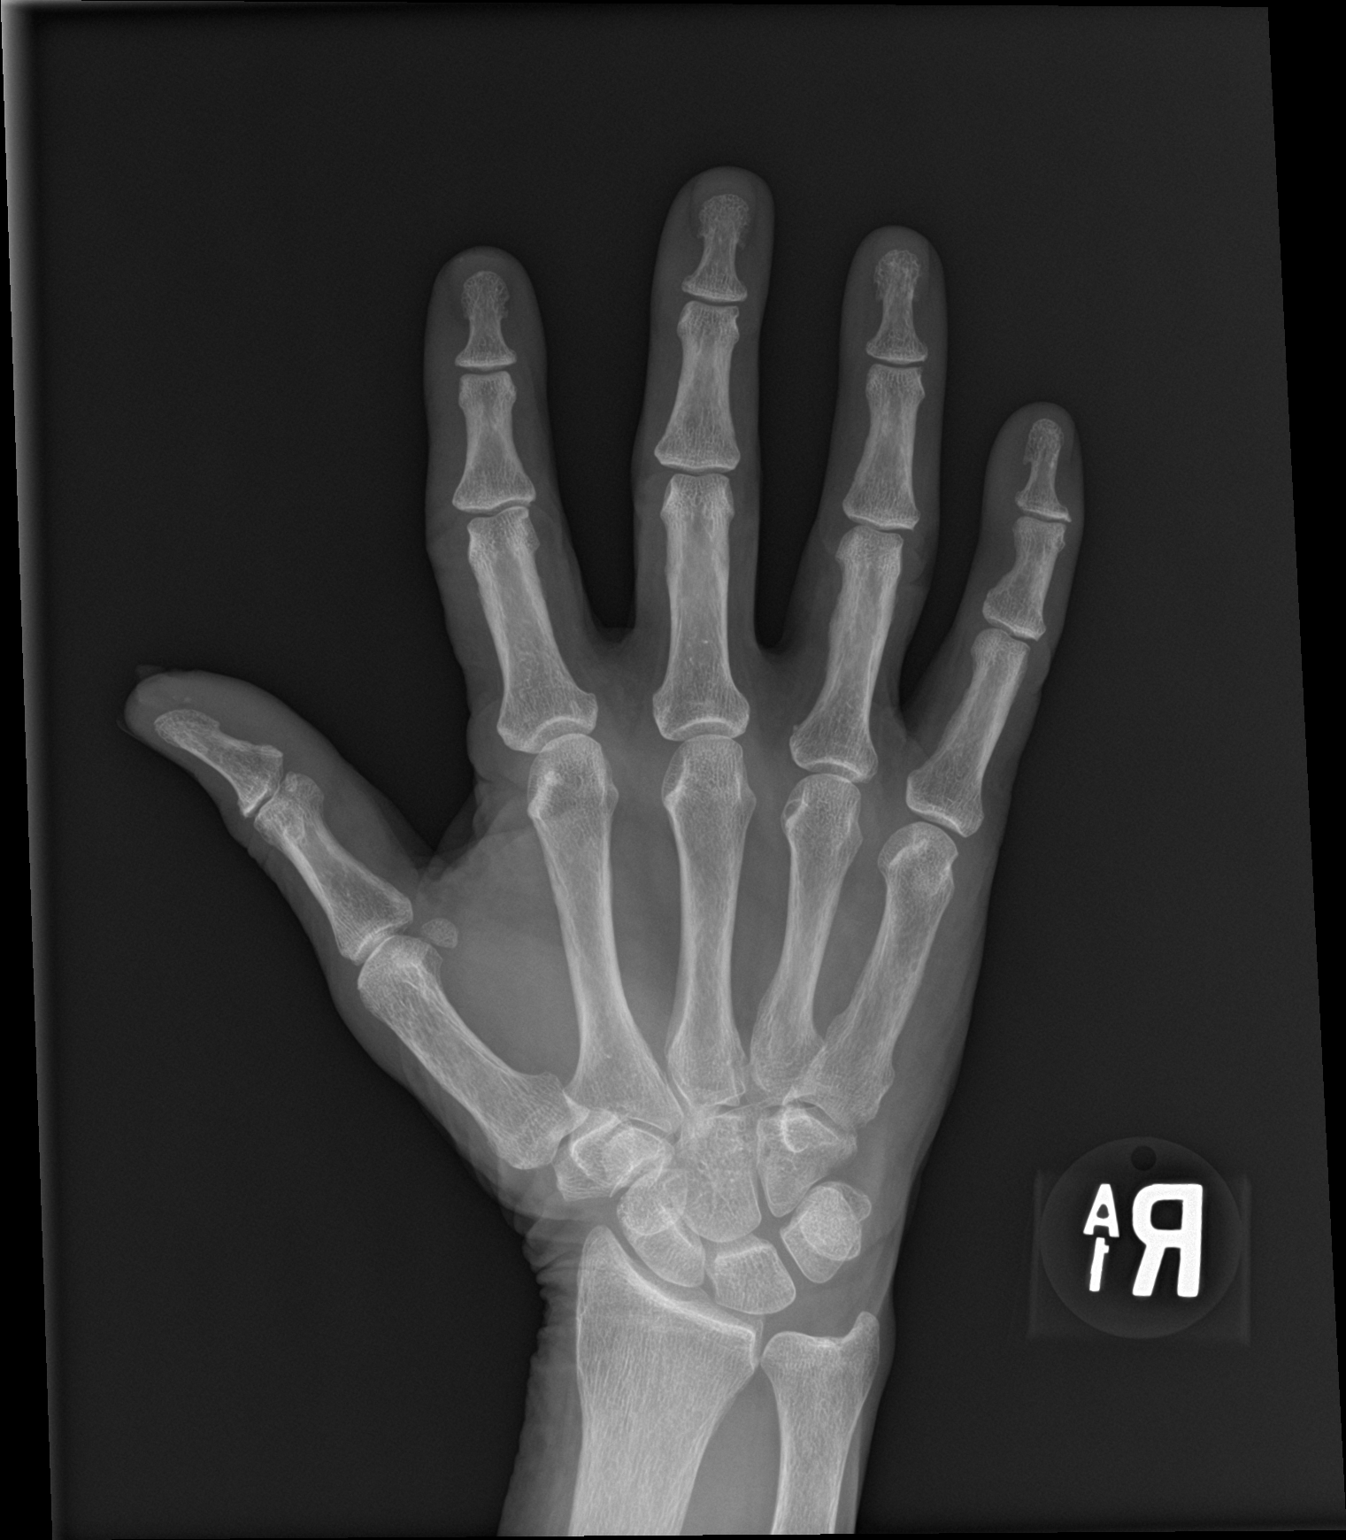
[im 2/3]
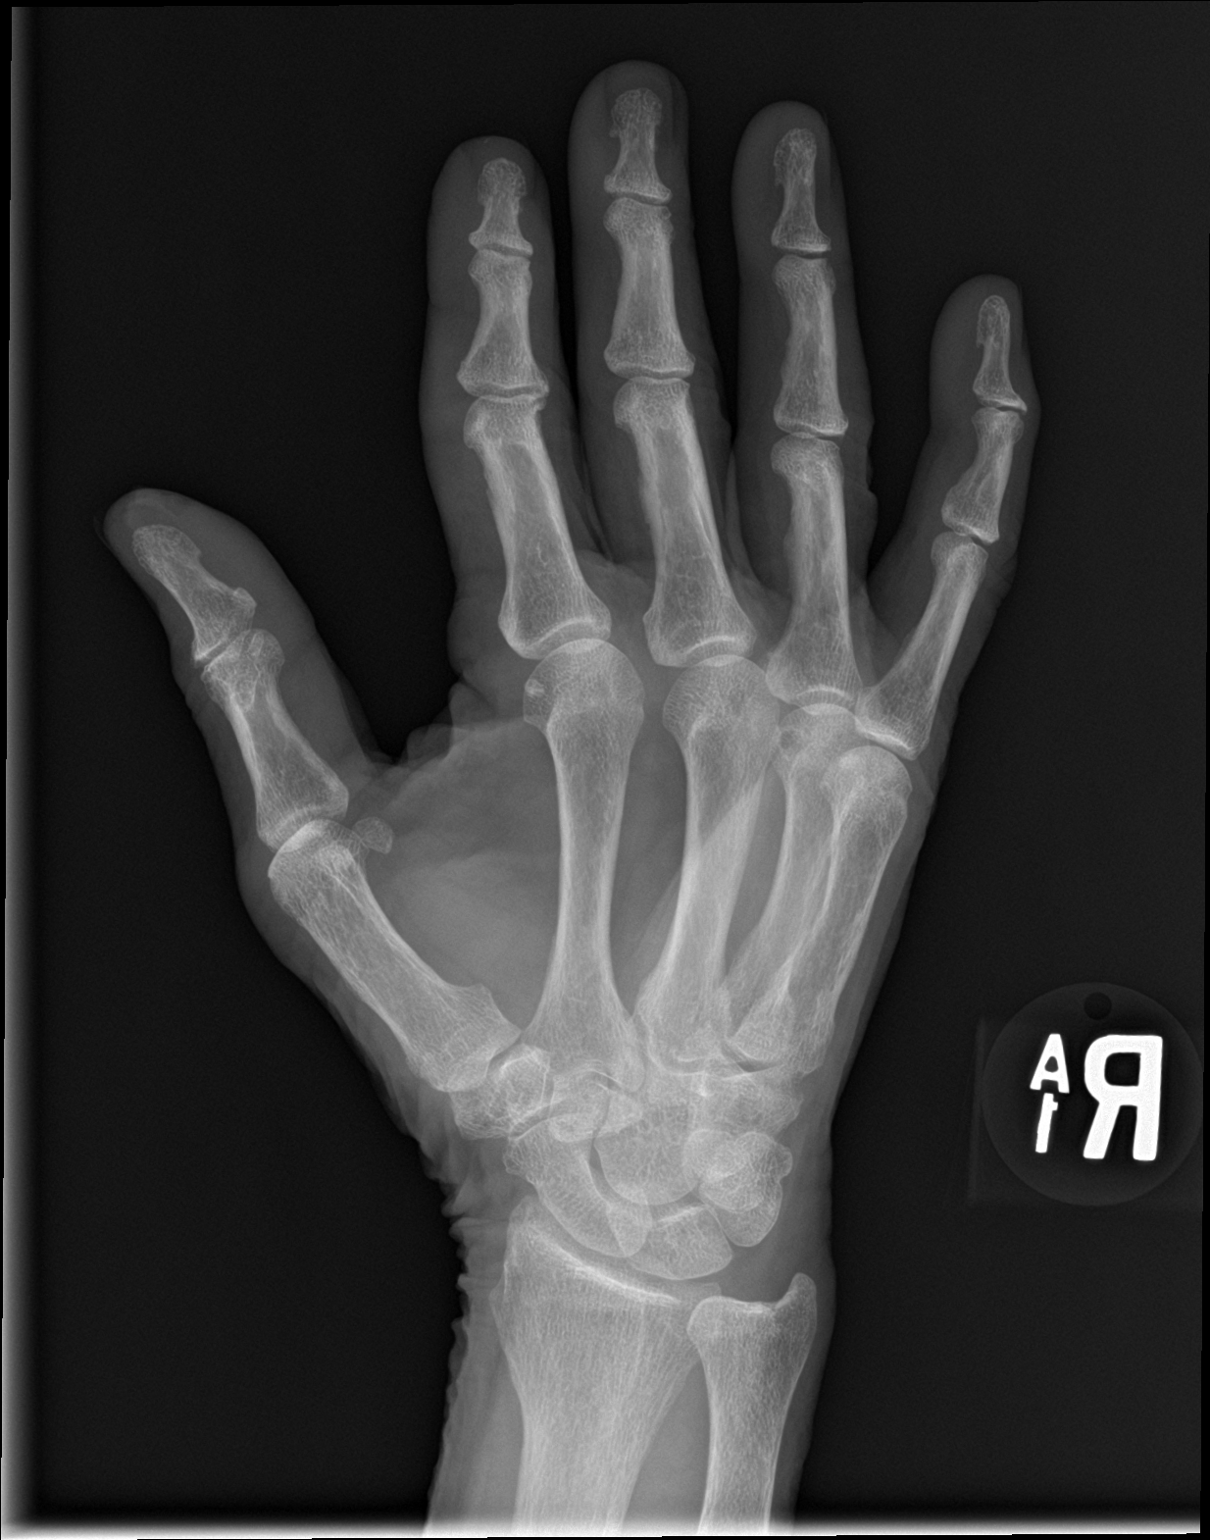
[im 3/3]
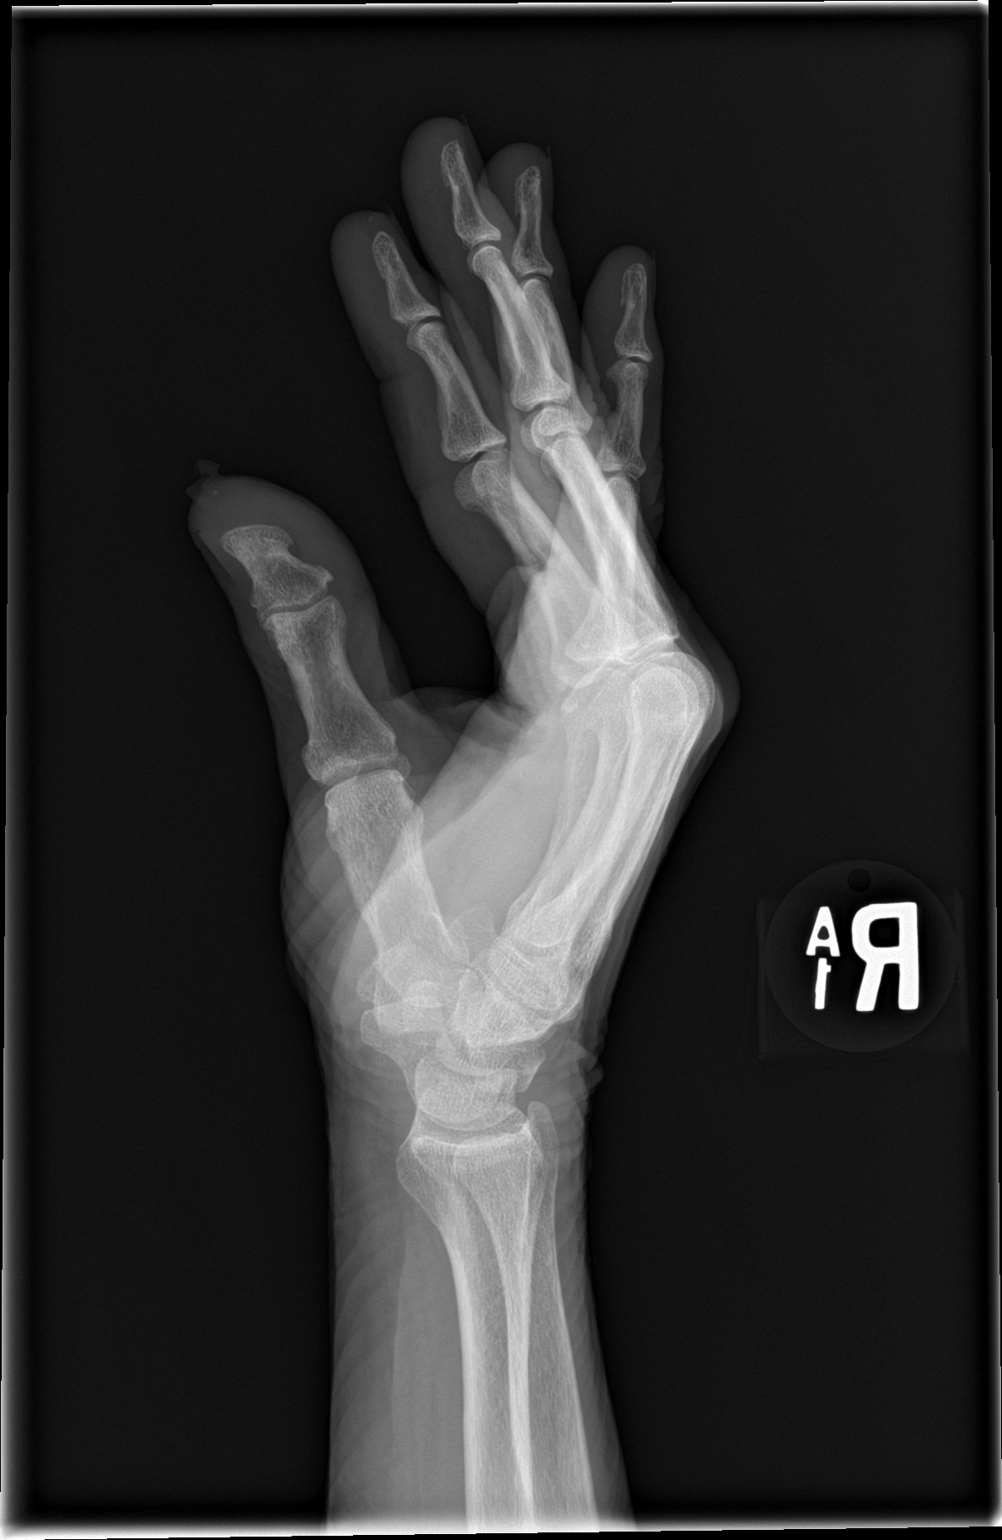

[3 of 3 positions shown; findings below may reference images not displayed]

FINDINGS: There is no acute fracture or subluxation. Soft tissue irregularity
identified at the distal aspect of the film. No radiopaque foreign
body.
IMPRESSION: No acute fracture.  Laceration of the thumb.

## 2019-03-08 IMAGING — CT CT HEAD W/O CM
5 of 7 series · 16 of 47 positions shown, 17 images · non-contrast
Comparison: Brain MRI 05/25/2016.  Neck CT 04/04/2014

CLINICAL DATA: Minor head trauma. High clinical risk. Fall in
bathroom. Left forehead contusion. Initial encounter.

EXAM:
CT HEAD WITHOUT CONTRAST
CT CERVICAL SPINE WITHOUT CONTRAST
TECHNIQUE: Multidetector CT imaging of the head and cervical spine was
performed following the standard protocol without intravenous
contrast. Multiplanar CT image reconstructions of the cervical spine
were also generated.

[Series 2: head wo · axial · 0.45mm/px · z∈[+467,+512]mm · 2 of 29 slices shown, 3 images]
[im 10/29  brain]
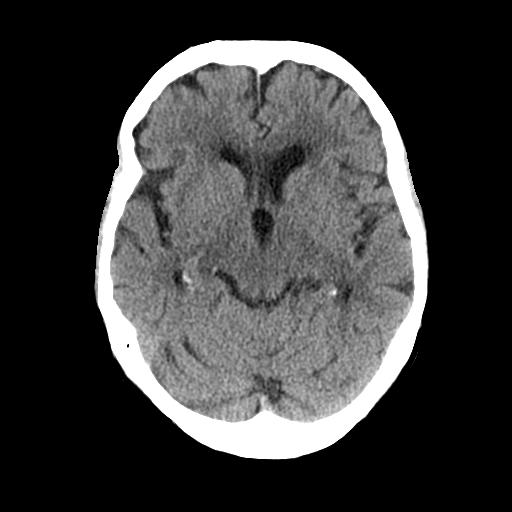
[im 10/29  bone]
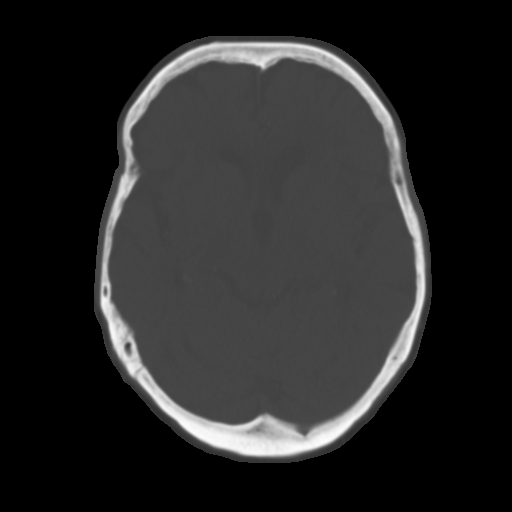
[im 19/29  brain]
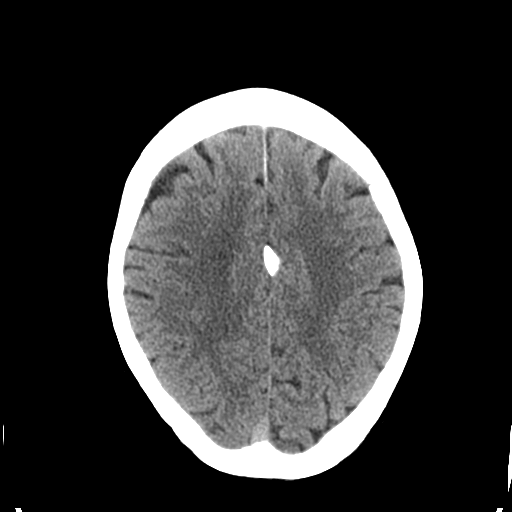

[Series 4: coronal soft tissue · coronal · 0.28mm/px · 3 of 67 slices shown]
[im 5/67  brain]
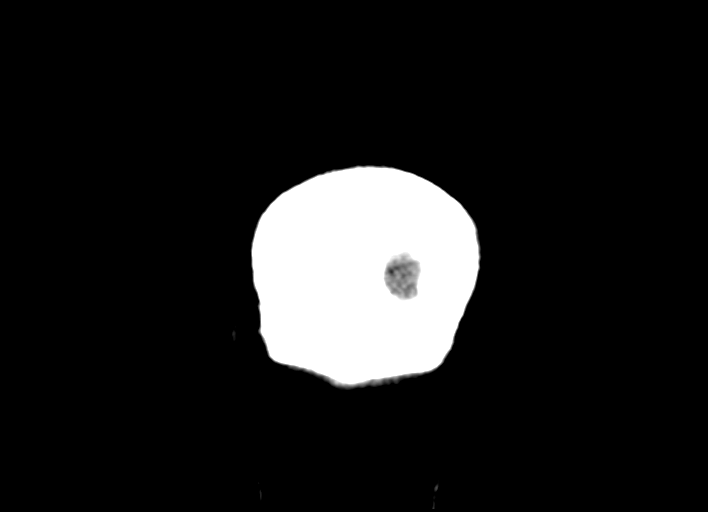
[im 6/67  brain]
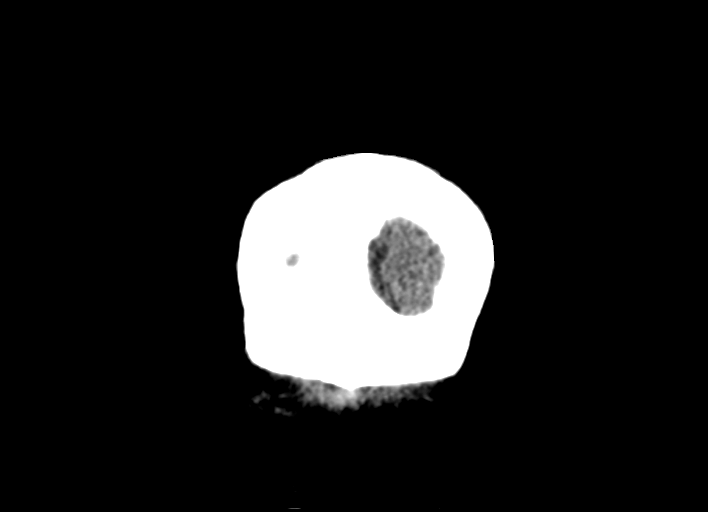
[im 8/67  brain]
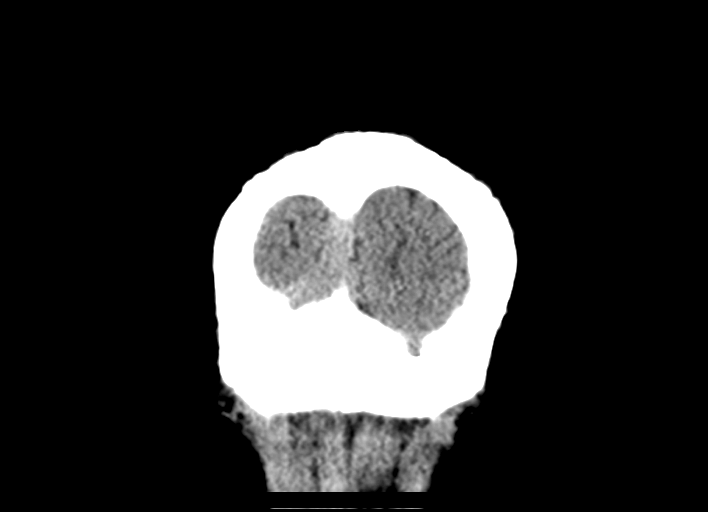

[Series 5: sagittal soft tissue · sagittal · 0.28mm/px · 1 of 51 slices shown]
[im 26/51  brain]
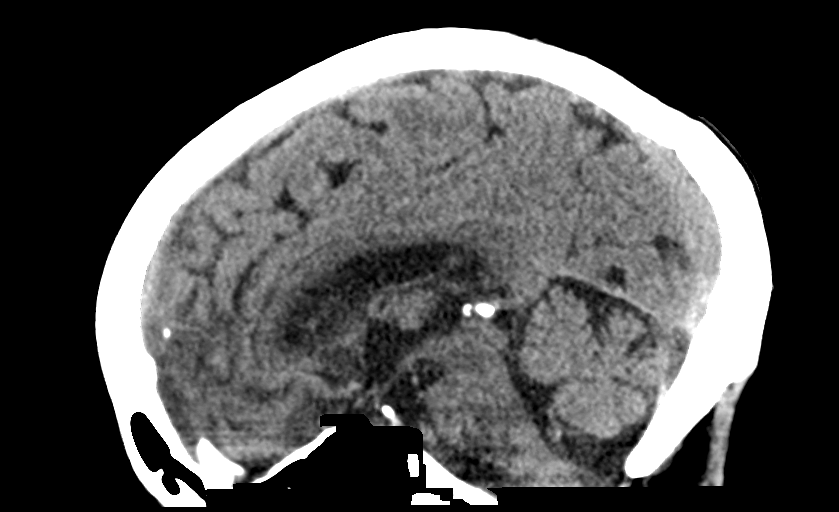

[Series 7: c spine soft · axial · 0.28mm/px · z∈[+273,+301]mm · 2 of 88 slices shown]
[im 8/88  brain]
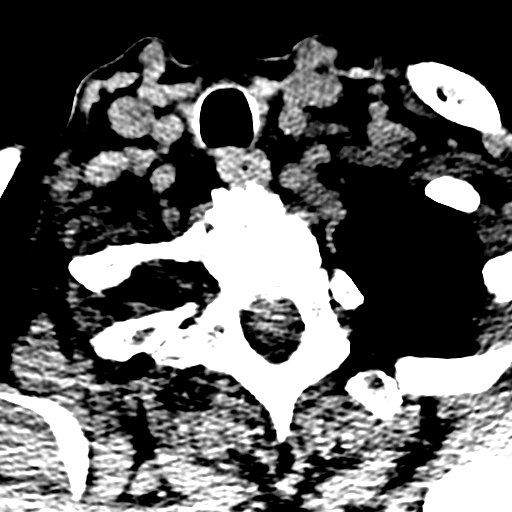
[im 22/88  brain]
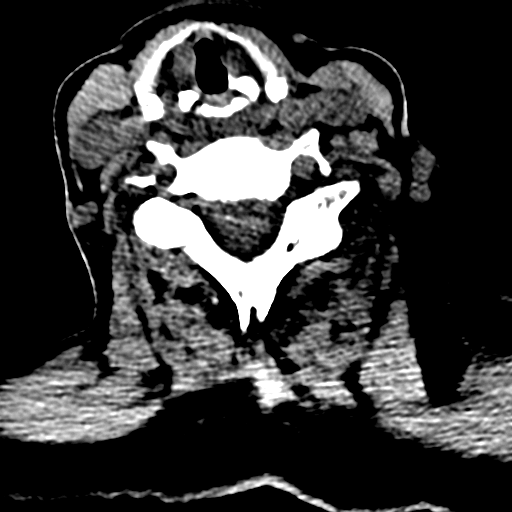

[Series 10: orthogonal bone · axial · 0.23mm/px · z∈[+262,+398]mm · 8 of 86 slices shown]
[im 8/86  bone]
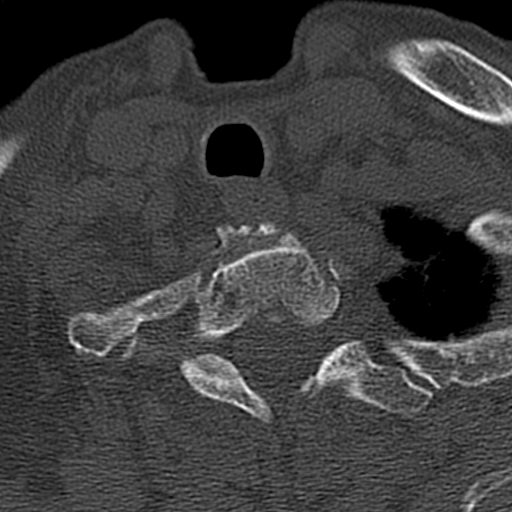
[im 22/86  bone]
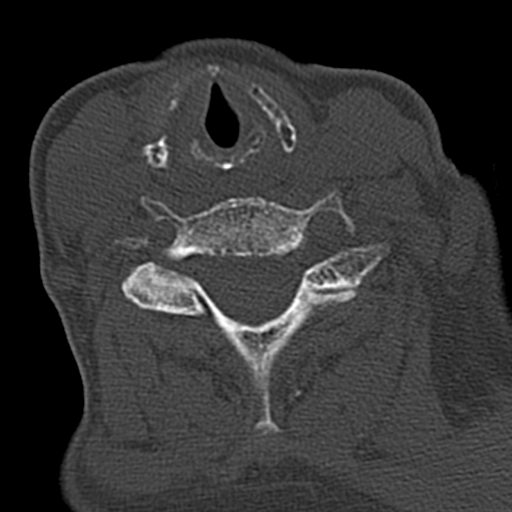
[im 29/86  bone]
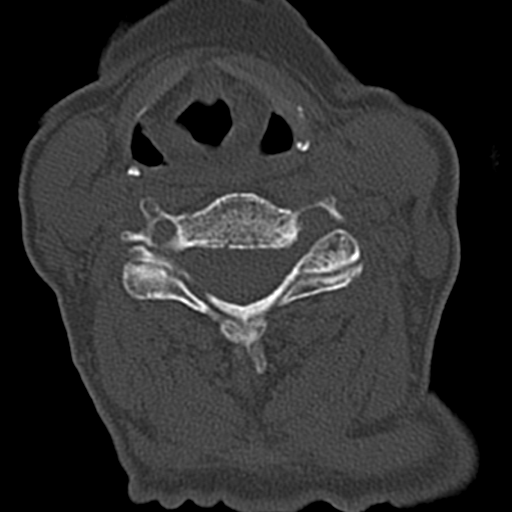
[im 36/86  bone]
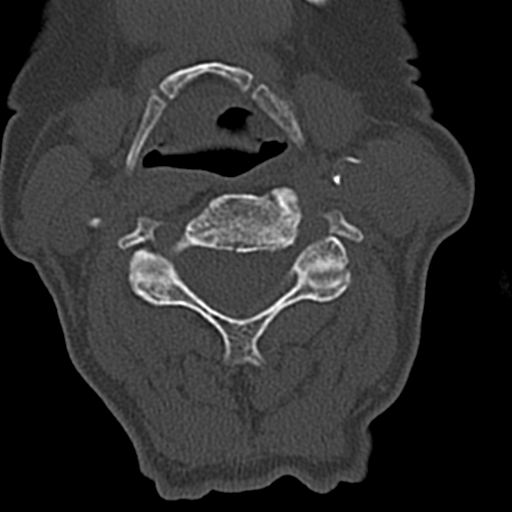
[im 50/86  bone]
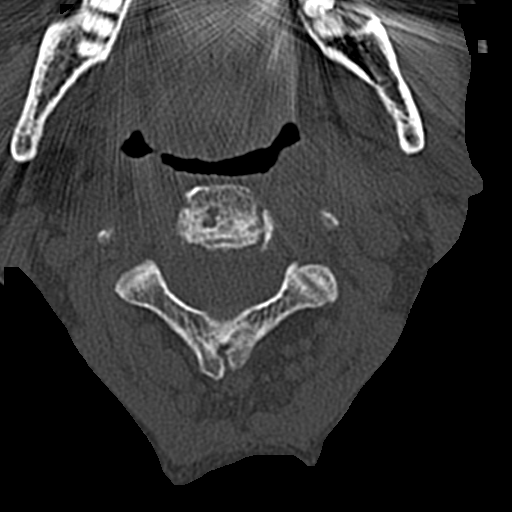
[im 57/86  bone]
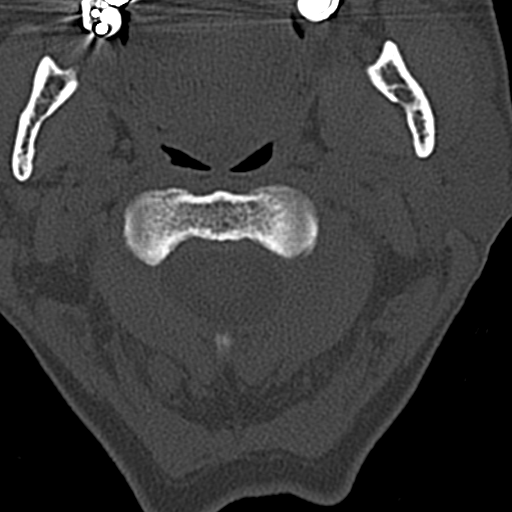
[im 64/86  bone]
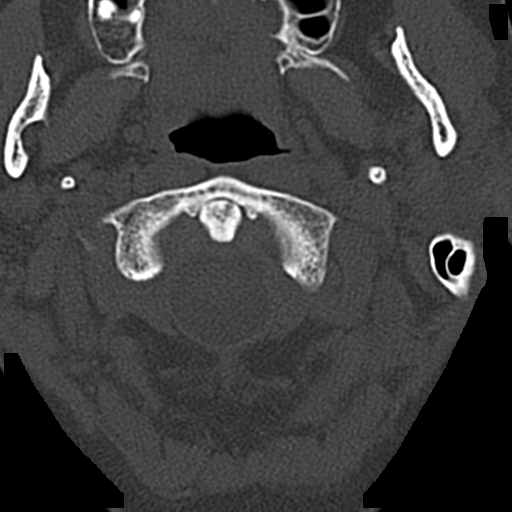
[im 78/86  bone]
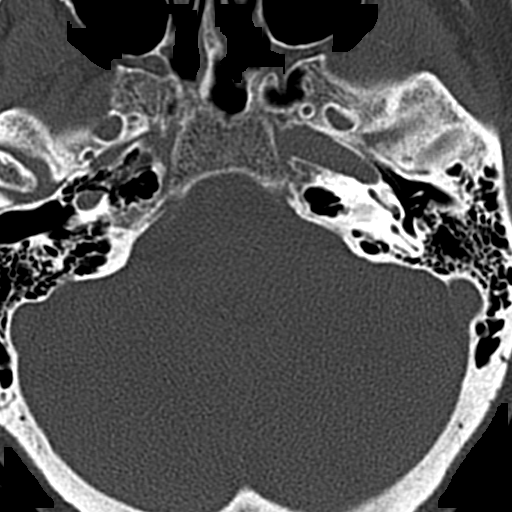

[16 of 47 positions shown; findings below may reference images not displayed]

FINDINGS: CT HEAD FINDINGS

Brain: No evidence of acute infarction, hemorrhage, hydrocephalus,
extra-axial collection or mass lesion/mass effect. Cerebral volume
loss and mild microvascular ischemic change.

Vascular: No hyperdense vessel or unexpected calcification.

Skull: There is a left forehead hematoma. Negative for underlying
fracture. No opaque foreign body.

Sinuses/Orbits: No visible postseptal injury on the left. Visualized
sinuses and mastoids are clear.

CT CERVICAL SPINE FINDINGS

Alignment: No traumatic malalignment. Mild degenerative
anterolisthesis at C2-3 and C6-7 that is stable.

Skull base and vertebrae: Negative for acute fracture. No aggressive
process.

Soft tissues and spinal canal: No prevertebral fluid or swelling. No
visible canal hematoma. Thyroidectomy.

Disc levels: Diffuse disc and facet degeneration without evidence of
cord impingement. The foramina show no bony stenosis on either side.

Upper chest: No evidence of injury
IMPRESSION: 1. No evidence of intracranial or cervical spine injury.
2. Left forehead contusion without fracture.

## 2019-03-08 NOTE — Telephone Encounter (Signed)
Colletta Maryland with [facility] asst living is requesting an order for a UA for the pt.  Pt is exhibiting some confusion and just not her regular self.  They are trying to determine what may be wrong.   Fax order with Dx code [phone]   cb  [phone]

## 2019-03-09 NOTE — Telephone Encounter (Signed)
Confirmed with son and nurse that patient did not have any other acute symptoms. Nurse stated that she was not really confused but was not acting like her normal self. Orders given for UA and culture.

## 2019-03-17 NOTE — Telephone Encounter (Signed)
Attempted to reach facility. Unable to reach.

## 2019-03-17 NOTE — Telephone Encounter (Signed)
Paige Farmer is calling to check the status of her order that was sent over a week ago.  She still has not heard anything from the nurse or doctor.  Please advise and call back to discuss at [phone]

## 2019-03-29 ENCOUNTER — Telehealth: Payer: Self-pay

## 2019-03-29 NOTE — Telephone Encounter (Signed)
Copied from [place] [id number]. Topic: General - Other >> Mar 29, 2019 12:59 PM Yvette Rack wrote: Reason for CRM: Colletta Maryland with [facility] called for an update on the UA that was sent in. Cb# [phone]

## 2019-03-29 NOTE — Telephone Encounter (Signed)
Called to let them know we have not received any urine results. Colletta Maryland was out of the office and return tomorrow.

## 2019-04-05 ENCOUNTER — Telehealth: Payer: Self-pay | Admitting: Internal Medicine

## 2019-04-05 NOTE — Telephone Encounter (Signed)
Levada Dy with [facility] calling and states that the patient had a fall outside around 3:50pm today (04/05/2019). Complains of left wrist pain, no bruising or swelling. States they would keep an eye on it and let office know if there is any change. States that she does have some scrapes on her knees. States that they think her falls may be due to blood sugars dropping. States that patient had a fall about 2 weeks ago also. States that patient is not eating a whole lot. Would like a PRN order to check her blood sugars. Please advise.  Fax#: [phone] CB#: [phone]

## 2019-04-05 NOTE — Telephone Encounter (Signed)
Letter printed signed and in blue folder and given to [facility] to handle.  This is Tourist information centre manager Paige Farmer's patient FYI

## 2019-04-05 NOTE — Telephone Encounter (Signed)
Letter has been faxed.

## 2019-04-06 NOTE — Telephone Encounter (Signed)
Was not in the office.  Reviewed notes.  It appears that pt fell and hurt her wrist.  Need update today.  If persistent wrist pain, needs to be evaluated.  Also confirm no other injuries and confirm pt did not hit her head.  Did she lose consciousness?  Was the fall witnessed?

## 2019-04-06 NOTE — Telephone Encounter (Signed)
No complaints today. Did develop a bruise on her left hand. She did not lose consciousness and she did not hit her head. Spoke with Levada Dy at [facility] to confirm patient is doing okay. Also, they are wanting to have order to check patients sugars because she has been having more falls and her appetite is not as good as it was. They were wanting to be able to check her sugar if needed. Verbal given to check sugars prn. Discussed this with Dr Nicki Reaper as well.

## 2019-04-06 NOTE — Telephone Encounter (Signed)
Called facility to follow up. Sat on hold for >10 minutes. Facility picked up phone and hung up. Will try again.

## 2019-04-06 NOTE — Telephone Encounter (Signed)
Left message for [name] to call back

## 2019-04-07 NOTE — Telephone Encounter (Signed)
Paige Farmer called stating she is going to fax back the UA results on 04/07/2019. Please contact her when received. Please advise.

## 2019-04-07 NOTE — Telephone Encounter (Signed)
Per discussion, no pain in hand.  Able to move without difficulty.  No other complaints.  Ok for sugar check.

## 2019-04-10 NOTE — Telephone Encounter (Signed)
Faxed results to [facility]. Urine was negative. Dr Nicki Reaper is recommending repeat urine to confirm blood clear.

## 2019-04-17 ENCOUNTER — Encounter: Payer: Self-pay | Admitting: Family Medicine

## 2019-04-17 ENCOUNTER — Ambulatory Visit (INDEPENDENT_AMBULATORY_CARE_PROVIDER_SITE_OTHER): Payer: Medicare Other | Admitting: Family Medicine

## 2019-04-17 VITALS — Ht 61.0 in | Wt 123.0 lb

## 2019-04-17 DIAGNOSIS — D2330 Other benign neoplasm of skin of unspecified part of face: Secondary | ICD-10-CM

## 2019-04-17 MED ORDER — CEPHALEXIN 500 MG PO CAPS: 500.0000 mg | ORAL_CAPSULE | Freq: Two times a day (BID) | ORAL | 0 refills | Status: DC

## 2019-04-17 NOTE — Progress Notes (Signed)
Patient ID: Paige Farmer, female   DOB: 04/18/1931, 85 y.o.   MRN: [id number]    Virtual Visit via video Note  This visit type was conducted due to national recommendations for restrictions regarding the COVID-19 pandemic (e.g. social distancing).  This format is felt to be most appropriate for this patient at this time.  All issues noted in this document were discussed and addressed.  No physical exam was performed (except for noted visual exam findings with Video Visits).   I connected with Paige Farmer today at 11:20 AM EDT by a video enabled telemedicine application or telephone and verified that I am speaking with the correct person using two identifiers. Location patient: home Location provider: work or home office Persons participating in the virtual visit: patient, provider, caretaker  I discussed the limitations, risks, security and privacy concerns of performing an evaluation and management service by video and the availability of in person appointments. I also discussed with the patient that there may be a patient responsible charge related to this service. The patient expressed understanding and agreed to proceed.  HPI:  Patient and I connected via video to discuss a possible stye or bump underneath right eye.  Patient and caregiver both states she had cellulitis back in June, treated with antibiotics and warm compresses in the area improved.  Area seems to have refilled.  States area is not draining, but does appear to have small whitehead.  Does not affect her vision at all.  Patient states area is a little tender.  Denies any fever or chills.  Is any spreading redness across other areas of face.  ROS: See pertinent positives and negatives per HPI.  Past Medical History:  Diagnosis Date  . Cancer ([place])    Thyroid  . Dementia ([place])   . Depression   . Glaucoma   . H/O: depression   . History of chicken pox   . Palpitations   . Thyroid cancer ([place])   . Thyroid disease     Past Surgical History:  Procedure Laterality Date  . ABDOMINAL HYSTERECTOMY    . APPENDECTOMY    . LAPAROSCOPIC HYSTERECTOMY  1970   ovaries left in place  . THYROIDECTOMY    . TONSILLECTOMY  1952    Family History  Problem Relation Age of Onset  . Cancer Mother        multiple myeloma  . Parkinson's disease Father   . Cancer - Lung Brother        x 2  . Breast cancer Neg Hx    Social History   Tobacco Use  . Smoking status: Never Smoker  . Smokeless tobacco: Never Used  Substance Use Topics  . Alcohol use: No    Alcohol/week: 0.0 standard drinks    Current Outpatient Medications:  .  aspirin EC 81 MG tablet, Take 81 mg by mouth daily., Disp: , Rfl:  .  busPIRone (BUSPAR) 10 MG tablet, Take 10 mg by mouth 3 (three) times daily., Disp: , Rfl:  .  dorzolamide-timolol (COSOPT) 22.3-6.8 MG/ML ophthalmic solution, INSTILL 1 DROP INTO BOTH EYES TWICE DAILY, Disp: 10 mL, Rfl: 0 .  hydroxypropyl methylcellulose / hypromellose (ISOPTO TEARS / GONIOVISC) 2.5 % ophthalmic solution, Place 2 drops into both eyes 2 (two) times daily., Disp: , Rfl:  .  levothyroxine (SYNTHROID, LEVOTHROID) 75 MCG tablet, TAKE 1 TABLET (75 MCG TOTAL) BY MOUTH DAILY., Disp: 30 tablet, Rfl: 11 .  liothyronine (CYTOMEL) 5 MCG tablet, TAKE 1.5 TABLETS (7.5  MCG TOTAL) BY MOUTH DAILY., Disp: 45 tablet, Rfl: 11 .  Melatonin 3 MG CAPS, Take 1 capsule by mouth Nightly. , Disp: , Rfl:  .  Multiple Vitamins-Minerals (CENTRUM) tablet, Take 1 tablet by mouth daily., Disp: , Rfl:  .  Multiple Vitamins-Minerals (PRESERVISION/LUTEIN) CAPS, Take by mouth 2 (two) times daily., Disp: , Rfl:  .  naproxen sodium (ALEVE) 220 MG tablet, Take 220 mg by mouth every 8 (eight) hours., Disp: , Rfl:  .  sertraline (ZOLOFT) 100 MG tablet, Take 1 tablet (100 mg total) by mouth daily. Please place in blister packs, Disp: 30 tablet, Rfl: 6 .  loperamide (IMODIUM A-D) 2 MG tablet, Take 2 mg by mouth 4 (four) times daily as needed for  diarrhea or loose stools., Disp: , Rfl:  .  mupirocin ointment (BACTROBAN) 2 %, Apply to affected area bid x 1 week. (Patient not taking: Reported on 04/17/2019), Disp: 22 g, Rfl: 0  EXAM:  GENERAL: alert, oriented, appears well and in no acute distress  HEENT: no obvious abnormalities on inspection of external nose and ears. Cyst about size of large pea underneath right eye near inner corner, appear red/raised and small white head in center.   NECK: normal movements of the head and neck  LUNGS: on inspection no signs of respiratory distress, breathing rate appears normal, no obvious gross SOB, gasping or wheezing  CV: no obvious cyanosis  MS: moves all visible extremities without noticeable abnormality  PSYCH/NEURO: pleasant and cooperative, no obvious depression or anxiety, speech and thought processing grossly intact  ASSESSMENT AND PLAN:  Discussed the following assessment and plan:  Cyst of face under right eye - I do not think the area is a stye, I think it is possibly a dermoid type cyst on the face.  We will treat with antibiotics and patient will again use warm compresses to help the area calm down to reduce redness.  Advised patient and caregiver that the area most likely will continue to refill if we do not have her see surgeon to possibly evaluate for surgical removal.  Patient and caregiver are agreeable for referral to surgery for evaluation of the area.  Advised if area increases in size, or they notice redness spreading across face, development of fever or chills to call office right away and or go to ER for evaluation.   I discussed the assessment and treatment plan with the patient. The patient was provided an opportunity to ask questions and all were answered. The patient agreed with the plan and demonstrated an understanding of the instructions.   The patient was advised to call back or seek an in-person evaluation if the symptoms worsen or if the condition fails to  improve as anticipated.  Jodelle Green, FNP

## 2019-04-20 ENCOUNTER — Telehealth: Payer: Self-pay | Admitting: Internal Medicine

## 2019-04-20 NOTE — Telephone Encounter (Signed)
Patient needs follow up appointment with PCP. Please schedule and advise when scheduled thanks.

## 2019-04-20 NOTE — Telephone Encounter (Signed)
Pt scheduled for 12/16 @ 12pm

## 2019-05-25 ENCOUNTER — Telehealth: Payer: Self-pay | Admitting: Internal Medicine

## 2019-05-25 NOTE — Telephone Encounter (Signed)
Pt son called and wanted to have lab orders sent to [facility]

## 2019-05-26 NOTE — Telephone Encounter (Signed)
Waiting on a call back from [facility] to set of doxy appt for pt

## 2019-05-26 NOTE — Telephone Encounter (Signed)
Called [facility] to see how labs need to be ordered. Waiting for call back from nurse.

## 2019-05-29 NOTE — Telephone Encounter (Signed)
Signed.  Placed on your desk.  

## 2019-05-29 NOTE — Telephone Encounter (Signed)
Spoke with Levada Dy. Set up doxy visit. I have placed rx for labs out on my desk for your signature.

## 2019-05-29 NOTE — Telephone Encounter (Signed)
Pt son called to follow up on appt. I advised pt son that nurse here is waiting to here from Nurse at [facility].

## 2019-05-30 ENCOUNTER — Other Ambulatory Visit: Payer: Self-pay

## 2019-05-30 ENCOUNTER — Ambulatory Visit (INDEPENDENT_AMBULATORY_CARE_PROVIDER_SITE_OTHER): Payer: Medicare Other | Admitting: Plastic Surgery

## 2019-05-30 ENCOUNTER — Encounter: Payer: Self-pay | Admitting: Plastic Surgery

## 2019-05-30 DIAGNOSIS — H04302 Unspecified dacryocystitis of left lacrimal passage: Secondary | ICD-10-CM

## 2019-05-30 MED ORDER — DOXYCYCLINE HYCLATE 100 MG PO TABS: 100.0000 mg | ORAL_TABLET | Freq: Every day | ORAL | 0 refills | Status: DC

## 2019-05-30 NOTE — Progress Notes (Signed)
Patient ID: Paige Farmer, female    DOB: 02-Nov-1930, 85 y.o.   MRN: [id number]   Chief Complaint  Patient presents with  . Skin Problem    The patient is a very nice 85 year old female here with her son for evaluation of some swelling on her face.  She was told she had a clogged right lower tear duct.  There appears to be a cystlike structure that is about a centimeter inferior and medial to the lower eyelid on the right.  The patient states that she does have some tearing of that eye.  Sometimes it is also itchy.  It has been red and swollen.  Antibiotics seem to help alleviate the swelling and redness.  She otherwise lives in a retirement facility and her son checks on her regularly.  There is some redness noted although no cellulitis.  It is slightly tender to touch.  Is firm and about 4 mm in size.   Review of Systems  Constitutional: Negative.   HENT: Negative for congestion, ear discharge and ear pain.   Eyes: Positive for discharge and itching.  Respiratory: Negative.   Cardiovascular: Negative.   Gastrointestinal: Negative.   Genitourinary: Negative.   Musculoskeletal: Positive for gait problem.  Psychiatric/Behavioral: Negative.     Past Medical History:  Diagnosis Date  . Cancer ([place])    Thyroid  . Dementia ([place])   . Depression   . Glaucoma   . H/O: depression   . History of chicken pox   . Palpitations   . Thyroid cancer ([place])   . Thyroid disease     Past Surgical History:  Procedure Laterality Date  . ABDOMINAL HYSTERECTOMY    . APPENDECTOMY    . LAPAROSCOPIC HYSTERECTOMY  1970   ovaries left in place  . THYROIDECTOMY    . TONSILLECTOMY  1952      Current Outpatient Medications:  .  aspirin EC 81 MG tablet, Take 81 mg by mouth daily., Disp: , Rfl:  .  busPIRone (BUSPAR) 10 MG tablet, Take 10 mg by mouth 3 (three) times daily., Disp: , Rfl:  .  cephALEXin (KEFLEX) 500 MG capsule, Take 1 capsule (500 mg total) by mouth 2 (two) times daily., Disp: 20  capsule, Rfl: 0 .  dorzolamide-timolol (COSOPT) 22.3-6.8 MG/ML ophthalmic solution, INSTILL 1 DROP INTO BOTH EYES TWICE DAILY, Disp: 10 mL, Rfl: 0 .  hydroxypropyl methylcellulose / hypromellose (ISOPTO TEARS / GONIOVISC) 2.5 % ophthalmic solution, Place 2 drops into both eyes 2 (two) times daily., Disp: , Rfl:  .  levothyroxine (SYNTHROID, LEVOTHROID) 75 MCG tablet, TAKE 1 TABLET (75 MCG TOTAL) BY MOUTH DAILY., Disp: 30 tablet, Rfl: 11 .  liothyronine (CYTOMEL) 5 MCG tablet, TAKE 1.5 TABLETS (7.5 MCG TOTAL) BY MOUTH DAILY., Disp: 45 tablet, Rfl: 11 .  loperamide (IMODIUM A-D) 2 MG tablet, Take 2 mg by mouth 4 (four) times daily as needed for diarrhea or loose stools., Disp: , Rfl:  .  Melatonin 3 MG CAPS, Take 1 capsule by mouth Nightly. , Disp: , Rfl:  .  Multiple Vitamins-Minerals (CENTRUM) tablet, Take 1 tablet by mouth daily., Disp: , Rfl:  .  Multiple Vitamins-Minerals (PRESERVISION/LUTEIN) CAPS, Take by mouth 2 (two) times daily., Disp: , Rfl:  .  mupirocin ointment (BACTROBAN) 2 %, Apply to affected area bid x 1 week. (Patient not taking: Reported on 04/17/2019), Disp: 22 g, Rfl: 0 .  naproxen sodium (ALEVE) 220 MG tablet, Take 220 mg by mouth every  8 (eight) hours., Disp: , Rfl:  .  sertraline (ZOLOFT) 100 MG tablet, Take 1 tablet (100 mg total) by mouth daily. Please place in blister packs, Disp: 30 tablet, Rfl: 6   Objective:   Vitals:   05/30/19 1313  BP: (!) 158/75  Pulse: 65  Temp: 97.8 F (36.6 C)  SpO2: 97%    Physical Exam Vitals signs and nursing note reviewed.  Cardiovascular:     Rate and Rhythm: Normal rate.  Pulmonary:     Effort: Pulmonary effort is normal.  Abdominal:     General: Abdomen is flat. There is no distension.  Skin:    General: Skin is warm.  Neurological:     Mental Status: She is alert. Mental status is at baseline.  Psychiatric:        Mood and Affect: Mood normal.     Assessment & Plan:  Infection of left tear duct  I am concerned  about tear duct involvement.  I recommend a specialist for the I and Dr. Lorina Rabon to further evaluate the tear duct so as not to injure it during the excision.  I will also try and call [facility] to talk with the ophthalmologist. [place], DO

## 2019-05-30 NOTE — Telephone Encounter (Signed)
Faxed to [facility] 

## 2019-06-02 ENCOUNTER — Encounter: Payer: Self-pay | Admitting: Plastic Surgery

## 2019-06-02 NOTE — Telephone Encounter (Signed)
Called and spoke with Paige Farmer) @ [facility] and gave her verbal orders for Doxycycline 100mg -Take 1 tablet by mouth daily, #20.    Called and spoke with the patient's son Paige Farmer) letting him know the Rx was called in.  He verbalized understanding and agreed.//AB/CMA

## 2019-06-06 ENCOUNTER — Encounter: Payer: Self-pay | Admitting: Internal Medicine

## 2019-06-07 ENCOUNTER — Ambulatory Visit (INDEPENDENT_AMBULATORY_CARE_PROVIDER_SITE_OTHER): Payer: Medicare Other | Admitting: Internal Medicine

## 2019-06-07 ENCOUNTER — Other Ambulatory Visit: Payer: Self-pay

## 2019-06-07 DIAGNOSIS — E7800 Pure hypercholesterolemia, unspecified: Secondary | ICD-10-CM | POA: Diagnosis not present

## 2019-06-07 DIAGNOSIS — F329 Major depressive disorder, single episode, unspecified: Secondary | ICD-10-CM

## 2019-06-07 DIAGNOSIS — R739 Hyperglycemia, unspecified: Secondary | ICD-10-CM | POA: Diagnosis not present

## 2019-06-07 DIAGNOSIS — D649 Anemia, unspecified: Secondary | ICD-10-CM | POA: Diagnosis not present

## 2019-06-07 DIAGNOSIS — C73 Malignant neoplasm of thyroid gland: Secondary | ICD-10-CM

## 2019-06-07 DIAGNOSIS — H04302 Unspecified dacryocystitis of left lacrimal passage: Secondary | ICD-10-CM

## 2019-06-07 DIAGNOSIS — F32A Depression, unspecified: Secondary | ICD-10-CM

## 2019-06-07 NOTE — Progress Notes (Signed)
Patient ID: Paige Farmer, female   DOB: 06-Apr-1931, 85 y.o.   MRN: [id number]   Virtual Visit via video Note  This visit type was conducted due to national recommendations for restrictions regarding the COVID-19 pandemic (e.g. social distancing).  This format is felt to be most appropriate for this patient at this time.  All issues noted in this document were discussed and addressed.  No physical exam was performed (except for noted visual exam findings with Video Visits).   I connected with Paige Farmer by a video enabled telemedicine application and verified that I am speaking with the correct person using two identifiers. Location patient: home Location provider: work Persons participating in the virtual visit: patient, provider and caretaker (aid) - Paige Farmer  The limitations, risks, security and privacy concerns of performing an evaluation and management service by video and the availability of in person appointments and been discussed. The patient expressed understanding and agreed to proceed.   Reason for visit: scheduled follow up.    HPI: Reports she is doing relatively well.  Staying in due to covid restrictions.  States she is eating.  She denies any nausea or vomiting.  Reports no chest pain or sob.  No acid reflux reported.  No abdominal pain or bowel change.  Discussed stress and depression.  She declines.  States she feels she is handling things well.  Discussed recent labs.  Cholesterol elevated. Recent liver panel wnl.    ROS: See pertinent positives and negatives per HPI.  Past Medical History:  Diagnosis Date  . Cancer ([place])    Thyroid  . Dementia ([place])   . Depression   . Glaucoma   . H/O: depression   . History of chicken pox   . Palpitations   . Thyroid cancer ([place])   . Thyroid disease     Past Surgical History:  Procedure Laterality Date  . ABDOMINAL HYSTERECTOMY    . APPENDECTOMY    . LAPAROSCOPIC HYSTERECTOMY  1970   ovaries left in place  .  THYROIDECTOMY    . TONSILLECTOMY  1952    Family History  Problem Relation Age of Onset  . Cancer Mother        multiple myeloma  . Parkinson's disease Father   . Cancer - Lung Brother        x 2  . Breast cancer Neg Hx     SOCIAL HX: reviewed.    Current Outpatient Medications:  Marland Kitchen  Multiple Vitamins-Minerals (CERTAVITE/ANTIOXIDANTS PO), Take 1 tablet by mouth at bedtime., Disp: , Rfl:  .  aspirin EC 81 MG tablet, Take 81 mg by mouth daily., Disp: , Rfl:  .  busPIRone (BUSPAR) 10 MG tablet, Take 10 mg by mouth 3 (three) times daily., Disp: , Rfl:  .  dorzolamide-timolol (COSOPT) 22.3-6.8 MG/ML ophthalmic solution, INSTILL 1 DROP INTO BOTH EYES TWICE DAILY, Disp: 10 mL, Rfl: 0 .  doxycycline (VIBRA-TABS) 100 MG tablet, Take 1 tablet (100 mg total) by mouth daily., Disp: 20 tablet, Rfl: 0 .  levothyroxine (SYNTHROID, LEVOTHROID) 75 MCG tablet, TAKE 1 TABLET (75 MCG TOTAL) BY MOUTH DAILY., Disp: 30 tablet, Rfl: 11 .  liothyronine (CYTOMEL) 5 MCG tablet, TAKE 1.5 TABLETS (7.5 MCG TOTAL) BY MOUTH DAILY., Disp: 45 tablet, Rfl: 11 .  Melatonin 3 MG CAPS, Take 1 capsule by mouth Nightly. , Disp: , Rfl:  .  Multiple Vitamins-Minerals (PRESERVISION/LUTEIN) CAPS, Take by mouth 2 (two) times daily., Disp: , Rfl:  .  naproxen sodium (ALEVE)  220 MG tablet, Take 220 mg by mouth every 8 (eight) hours., Disp: , Rfl:  .  sertraline (ZOLOFT) 100 MG tablet, Take 1 tablet (100 mg total) by mouth daily. Please place in blister packs, Disp: 30 tablet, Rfl: 6  EXAM:  VITALS per patient if applicable:  Weight 034.9 lbs  GENERAL: alert, oriented, appears well and in no acute distress  HEENT: atraumatic, conjunttiva clear, no obvious abnormalities on inspection of external nose and ears  NECK: normal movements of the head and neck  LUNGS: on inspection no signs of respiratory distress, breathing rate appears normal, no obvious gross SOB, gasping or wheezing  CV: no obvious cyanosis  PSYCH/NEURO:  pleasant and cooperative, no obvious depression or anxiety, speech and thought processing grossly intact  ASSESSMENT AND PLAN:  Discussed the following assessment and plan:  Anemia Follow cbc.   Depression On zoloft.  Feels she is doing well.  Follow.    Hypercholesterolemia Follow lipid panel.   Hyperglycemia Recent a1c 5.6.  Follow.    Infection of left tear duct Seeing ophthalmology.    Malignant neoplasm of thyroid gland (HCC) Saw Dr Rogue Bussing 08/30/18.  Stable.      I discussed the assessment and treatment plan with the patient. The patient was provided an opportunity to ask questions and all were answered. The patient agreed with the plan and demonstrated an understanding of the instructions.   The patient was advised to call back or seek an in-person evaluation if the symptoms worsen or if the condition fails to improve as anticipated.   Einar Pheasant, MD

## 2019-06-11 ENCOUNTER — Encounter: Payer: Self-pay | Admitting: Internal Medicine

## 2019-06-11 NOTE — Assessment & Plan Note (Signed)
Recent a1c 5.6.  Follow.

## 2019-06-11 NOTE — Assessment & Plan Note (Signed)
Follow lipid panel.   

## 2019-06-11 NOTE — Assessment & Plan Note (Signed)
Saw Dr Rogue Bussing 08/30/18.  Stable.

## 2019-06-11 NOTE — Assessment & Plan Note (Signed)
Seeing ophthalmology.

## 2019-06-11 NOTE — Assessment & Plan Note (Signed)
On zoloft.  Feels she is doing well.  Follow.

## 2019-06-11 NOTE — Assessment & Plan Note (Signed)
Follow cbc.  

## 2019-08-30 ENCOUNTER — Other Ambulatory Visit: Payer: Medicare Other

## 2019-08-30 ENCOUNTER — Ambulatory Visit: Payer: Medicare Other | Admitting: Internal Medicine

## 2019-09-01 ENCOUNTER — Other Ambulatory Visit: Payer: Self-pay | Admitting: Internal Medicine

## 2019-09-01 DIAGNOSIS — Z760 Encounter for issue of repeat prescription: Secondary | ICD-10-CM

## 2019-10-25 ENCOUNTER — Telehealth: Payer: Self-pay | Admitting: Internal Medicine

## 2019-10-25 NOTE — Telephone Encounter (Signed)
[place]. Unable to reach someone clinical.

## 2019-10-25 NOTE — Telephone Encounter (Signed)
Pt fell today 10/25/19. She didn't get hurt. Fell on knees and range of motion is good. Vital signs are also good. Per there protocol, they have to notify pt's pcp of fall.

## 2019-10-27 NOTE — Telephone Encounter (Signed)
Spoke with patients son. He has visited with her since fall. Confirmed patient is doing ok. No injury noted.

## 2019-12-06 ENCOUNTER — Telehealth: Payer: Self-pay | Admitting: Internal Medicine

## 2019-12-06 NOTE — Telephone Encounter (Signed)
Patient's son phoned on this date and stated that he would not be available in September for patient's appt and needed to move appt to December. Appts rescheduled to 05-30-20.

## 2019-12-20 ENCOUNTER — Ambulatory Visit: Payer: Medicare Other | Admitting: Internal Medicine

## 2019-12-21 ENCOUNTER — Emergency Department: Payer: Medicare Other

## 2019-12-21 ENCOUNTER — Emergency Department
Admission: EM | Admit: 2019-12-21 | Discharge: 2019-12-21 | Disposition: A | Discharge status: Skilled Nursing Facility | Payer: Medicare Other | Attending: Emergency Medicine | Admitting: Emergency Medicine

## 2019-12-21 ENCOUNTER — Other Ambulatory Visit: Payer: Self-pay

## 2019-12-21 DIAGNOSIS — Y998 Other external cause status: Secondary | ICD-10-CM | POA: Diagnosis not present

## 2019-12-21 DIAGNOSIS — Y9389 Activity, other specified: Secondary | ICD-10-CM | POA: Diagnosis not present

## 2019-12-21 DIAGNOSIS — W1830XA Fall on same level, unspecified, initial encounter: Secondary | ICD-10-CM | POA: Diagnosis not present

## 2019-12-21 DIAGNOSIS — Y9289 Other specified places as the place of occurrence of the external cause: Secondary | ICD-10-CM | POA: Insufficient documentation

## 2019-12-21 DIAGNOSIS — W19XXXA Unspecified fall, initial encounter: Secondary | ICD-10-CM

## 2019-12-21 DIAGNOSIS — F0390 Unspecified dementia without behavioral disturbance: Secondary | ICD-10-CM | POA: Diagnosis not present

## 2019-12-21 DIAGNOSIS — S0990XA Unspecified injury of head, initial encounter: Secondary | ICD-10-CM | POA: Diagnosis present

## 2019-12-21 DIAGNOSIS — S0003XA Contusion of scalp, initial encounter: Secondary | ICD-10-CM | POA: Insufficient documentation

## 2019-12-21 DIAGNOSIS — S0101XA Laceration without foreign body of scalp, initial encounter: Secondary | ICD-10-CM

## 2019-12-21 LAB — BASIC METABOLIC PANEL
Anion gap: 8 (ref 5–15)
BUN: 19 mg/dL (ref 8–23)
CO2: 27 mmol/L (ref 22–32)
Calcium: 8.9 mg/dL (ref 8.9–10.3)
Chloride: 103 mmol/L (ref 98–111)
Creatinine, Ser: 0.76 mg/dL (ref 0.44–1.00)
GFR calc Af Amer: >60 mL/min (ref >60)
GFR calc non Af Amer: >60 mL/min (ref >60)
Glucose, Bld: 102 mg/dL — ABNORMAL HIGH (ref 70–99)
Potassium: 4.1 mmol/L (ref 3.5–5.1)
Sodium: 138 mmol/L (ref 135–145)

## 2019-12-21 LAB — CBC WITH DIFFERENTIAL/PLATELET
Abs Immature Granulocytes: 0.14 10*3/uL — ABNORMAL HIGH (ref 0.00–0.07)
Basophils Absolute: 0.1 10*3/uL (ref 0.0–0.1)
Basophils Relative: 1 %
Eosinophils Absolute: 0.1 10*3/uL (ref 0.0–0.5)
Eosinophils Relative: 1 %
HCT: 35.4 % — ABNORMAL LOW (ref 36.0–46.0)
Hemoglobin: 11.7 g/dL — ABNORMAL LOW (ref 12.0–15.0)
Immature Granulocytes: 1 %
Lymphocytes Relative: 26 %
Lymphs Abs: 2.5 10*3/uL (ref 0.7–4.0)
MCH: 29.6 pg (ref 26.0–34.0)
MCHC: 33.1 g/dL (ref 30.0–36.0)
MCV: 89.6 fL (ref 80.0–100.0)
Monocytes Absolute: 0.5 10*3/uL (ref 0.1–1.0)
Monocytes Relative: 6 %
Neutro Abs: 6.3 10*3/uL (ref 1.7–7.7)
Neutrophils Relative %: 65 %
Platelets: 236 10*3/uL (ref 150–400)
RBC: 3.95 MIL/uL (ref 3.87–5.11)
RDW: 14.9 % (ref 11.5–15.5)
WBC: 9.7 10*3/uL (ref 4.0–10.5)
nRBC: 0 % (ref 0.0–0.2)

## 2019-12-21 MED ORDER — LIDOCAINE-EPINEPHRINE 2 %-1:100000 IJ SOLN
20.0000 mL | Freq: Once | INTRAMUSCULAR | Status: AC
  Administered 2019-12-21: 20 mL
  Filled 2019-12-21: qty 1

## 2019-12-21 NOTE — ED Notes (Signed)
Updated son on pt's condition and disposition.

## 2019-12-21 NOTE — ED Provider Notes (Signed)
[facility] Emergency Department Provider Note   ____________________________________________   First MD Initiated Contact with Patient 12/21/19 0802     (approximate)  I have reviewed the triage vital signs and the nursing notes.   HISTORY  Chief Complaint Fall   HPI Paige Farmer is a 86 y.o. female with past medical history of dementia, thyroid cancer, and hyperlipidemia who presents to the ED following fall.  Patient arrives from [facility] via EMS, who states that she fell and hit her head this morning.  Patient states she is not sure how she fell but EMS reports she fell backwards striking her head on furniture.  She denies any LOC and is not anticoagulated, EMS noted laceration to the back of her head with controlled bleeding.  She currently endorses some headache as well as pain over her left lateral chest wall, but denies any neck pain, abdominal pain, or hip pain, or extremity pain.  Patient states she has otherwise been feeling well with no fevers, cough, chest pain, shortness of breath.  Patient is at her reported baseline mental status.        Past Medical History:  Diagnosis Date  . Cancer ([place])    Thyroid  . Dementia ([place])   . Depression   . Glaucoma   . H/O: depression   . History of chicken pox   . Palpitations   . Thyroid cancer ([place])   . Thyroid disease     Patient Active Problem List   Diagnosis Date Noted  . Infection of left tear duct 05/30/2019  . Memory change 04/20/2016  . Elevated blood pressure reading 04/20/2016  . Hyperglycemia 10/03/2015  . Osteopenia 07/18/2015  . Disease of thyroid gland 07/18/2015  . Loss of weight 04/16/2015  . Health care maintenance 01/13/2015  . Beat, premature ventricular 09/11/2014  . Abdominal cramping 08/09/2014  . Acute diarrhea 08/09/2014  . Abnormal liver enzymes 08/09/2014  . Lump in neck 05/19/2014  . Localized swelling, mass, and lump of head 05/19/2014  . Abnormal liver  function tests 05/14/2014  . Edema 05/14/2014  . Abnormal blood chemistry 05/14/2014  . Hypercholesterolemia 03/14/2014  . Pure hypercholesterolemia 03/14/2014  . Constipation 01/04/2014  . Anemia 01/04/2014  . Palpitations 10/01/2013  . Glaucoma 10/01/2013  . Depression 10/01/2013  . Head pain 10/01/2013  . Right hip pain 10/01/2013  . Cephalalgia 10/01/2013  . Malignant neoplasm of thyroid gland ([place]) 10/01/2013  . Arthralgia of hip or thigh 10/01/2013  . Degenerative arthritis of hip 07/28/2012  . HZV (herpes zoster virus) post herpetic neuralgia 03/23/2012  . Fatigue 12/22/2011    Past Surgical History:  Procedure Laterality Date  . ABDOMINAL HYSTERECTOMY    . APPENDECTOMY    . LAPAROSCOPIC HYSTERECTOMY  1970   ovaries left in place  . THYROIDECTOMY    . TONSILLECTOMY  1952    Prior to Admission medications   Medication Sig Start Date End Date Taking? Authorizing Provider  ASPIRIN LOW DOSE 81 MG EC tablet TAKE 1 TABLET BY MOUTH ONCE DAILY Patient taking differently: Take 81 mg by mouth daily.  09/01/19  Yes Einar Pheasant, MD  busPIRone (BUSPAR) 10 MG tablet TAKE 1 TABLET BY MOUTH TWICE A DAY Patient taking differently: Take 10 mg by mouth 2 (two) times daily.  09/01/19  Yes Einar Pheasant, MD  dorzolamide-timolol (COSOPT) 22.3-6.8 MG/ML ophthalmic solution INSTILL 1 DROP IN [facility] EYE TWICE DAILY Patient taking differently: Place 1 drop into both eyes 2 (two)  times daily.  09/01/19  Yes Einar Pheasant, MD  hydrocortisone 2.5 % ointment Apply 1 application topically 2 (two) times daily. 11/22/19  Yes [provider]  levothyroxine (SYNTHROID) 75 MCG tablet TAKE 1 TABLET BY MOUTH AT BEDTIME ON AN EMPTY STOMACH Patient taking differently: Take 75 mcg by mouth at bedtime. TAKE 1 TABLET BY MOUTH AT BEDTIME ON AN EMPTY STOMACH 09/01/19  Yes Einar Pheasant, MD  liothyronine (CYTOMEL) 5 MCG tablet TAKE 1 & 1/2 TABLETS BY MOUTH ONCE DAILY Patient taking differently: Take  7.5 mcg by mouth daily. TAKE 1 & 1/2 TABLETS (7.5 mcg) BY MOUTH ONCE DAILY 09/01/19  Yes Einar Pheasant, MD  melatonin 3 MG TABS tablet Take 6 mg by mouth at bedtime.    Yes [provider]  Multiple Vitamins-Minerals (CERTAVITE SENIOR) TABS TAKE 1 TABLET BY MOUTH AT BEDTIME 09/01/19  Yes Einar Pheasant, MD  Multiple Vitamins-Minerals (PRESERVISION AREDS) TABS TAKE 1 TABLET BY MOUTH TWICE A DAY 09/01/19  Yes Einar Pheasant, MD  sertraline (ZOLOFT) 100 MG tablet TAKE 1 TABLET BY MOUTH ONCE DAILY Patient taking differently: Take 100 mg by mouth daily.  09/01/19  Yes Einar Pheasant, MD  naproxen sodium (ALEVE) 220 MG tablet Take 220 mg by mouth every 8 (eight) hours. Patient not taking: Reported on 12/21/2019    [provider]    Allergies Doxepin  Family History  Problem Relation Age of Onset  . Cancer Mother        multiple myeloma  . Parkinson's disease Father   . Cancer - Lung Brother        x 2  . Breast cancer Neg Hx     Social History Social History   Tobacco Use  . Smoking status: Never Smoker  . Smokeless tobacco: Never Used  Vaping Use  . Vaping Use: Never used  Substance Use Topics  . Alcohol use: No    Alcohol/week: 0.0 standard drinks  . Drug use: No    Review of Systems  Constitutional: No fever/chills Eyes: No visual changes. ENT: No sore throat. Cardiovascular: Denies chest pain.  Positive for left lateral chest wall pain. Respiratory: Denies shortness of breath. Gastrointestinal: No abdominal pain.  No nausea, no vomiting.  No diarrhea.  No constipation. Genitourinary: Negative for dysuria. Musculoskeletal: Negative for back pain. Skin: Negative for rash. Neurological: Positive for headaches, negative for focal weakness or numbness.  ____________________________________________   PHYSICAL EXAM:  VITAL SIGNS: ED Triage Vitals  Enc Vitals Group     BP      Pulse      Resp      Temp      Temp src      SpO2      Weight       Height      Head Circumference      [facility]      Pain Score      Pain Loc      Pain Edu?      Excl. in [place]?     Constitutional: Alert and oriented to person and place, but not time. Eyes: Conjunctivae are normal. Head: 2 cm laceration to occipital scalp with controlled bleeding. Nose: No congestion/rhinnorhea. Mouth/Throat: Mucous membranes are moist. Neck: Normal ROM, no midline cervical spine tenderness. Cardiovascular: Normal rate, regular rhythm. Grossly normal heart sounds. Respiratory: Normal respiratory effort.  No retractions. Lungs CTAB. Gastrointestinal: Soft and nontender. No distention. Genitourinary: deferred Musculoskeletal: No lower extremity tenderness nor edema.  Pelvis stable.  No upper extremity bony tenderness. Neurologic:  Normal speech and language. No gross focal neurologic deficits are appreciated. Skin:  Skin is warm, dry and intact. No rash noted. Psychiatric: Mood and affect are normal. Speech and behavior are normal.  ____________________________________________   LABS (all labs ordered are listed, but only abnormal results are displayed)  Labs Reviewed  CBC WITH DIFFERENTIAL/PLATELET - Abnormal; Notable for the following components:      Result Value   Hemoglobin 11.7 (*)    HCT 35.4 (*)    Abs Immature Granulocytes 0.14 (*)    All other components within normal limits  BASIC METABOLIC PANEL - Abnormal; Notable for the following components:   Glucose, Bld 102 (*)    All other components within normal limits   ____________________________________________  EKG  ED ECG REPORT I, Blake Divine, the attending physician, personally viewed and interpreted this ECG.   Date: 12/21/2019  EKG Time: 8:07  Rate: 64  Rhythm: normal sinus rhythm  Axis: Normal  Intervals:none  ST&T Change: None   PROCEDURES  Procedure(s) performed (including Critical Care):  Marland KitchenMarland KitchenLaceration Repair  Date/Time: 12/21/2019 9:52 AM Performed by: Blake Divine,  MD Authorized by: Blake Divine, MD   Consent:    Consent obtained:  Verbal   Consent given by:  Patient Anesthesia (see MAR for exact dosages):    Anesthesia method:  Local infiltration   Local anesthetic:  Lidocaine 2% WITH epi Laceration details:    Location:  Scalp   Scalp location:  L parietal   Length (cm):  3 Repair type:    Repair type:  Simple Pre-procedure details:    Preparation:  Patient was prepped and draped in usual sterile fashion Exploration:    Wound exploration: wound explored through full range of motion and entire depth of wound probed and visualized     Contaminated: no   Treatment:    Area cleansed with:  Saline   Amount of cleaning:  Standard   Irrigation solution:  Sterile saline   Irrigation method:  Pressure wash   Visualized foreign bodies/material removed: no   Skin repair:    Repair method:  Staples   Number of staples:  6 Approximation:    Approximation:  Loose Post-procedure details:    Dressing:  Open (no dressing)   Patient tolerance of procedure:  Tolerated well, no immediate complications     ____________________________________________   INITIAL IMPRESSION / ASSESSMENT AND PLAN / ED COURSE       86 year old female with past medical history of dementia, thyroid cancer, and hyperlipidemia presents to the ED following unwitnessed fall where she appeared to strike the back of her head on furniture.  Patient is unclear on how she fell and we will screen EKG and basic labs.  No urinary symptoms to suggest UTI.  Also further assess with CT head and C-spine, chest x-ray given her chest wall pain.  She is not anticoagulated and she is at her baseline mental status with no focal neurologic deficits.  Her posterior scalp laceration will likely require repair with staples.  CT head and C-spine negative for acute process, EKG without evidence of arrhythmia or ischemia, basic labs are unremarkable.  Patient scalp laceration was repaired with  staples and she is appropriate for discharge back to nursing facility.      ____________________________________________   FINAL CLINICAL IMPRESSION(S) / ED DIAGNOSES  Final diagnoses:  Fall, initial encounter  Laceration of scalp, initial encounter     ED Discharge Orders  None       Note:  This document was prepared using Dragon voice recognition software and may include unintentional dictation errors.   Blake Divine, MD 12/21/19 [phone]

## 2019-12-21 NOTE — ED Triage Notes (Signed)
Pt here via [facility] from [facility] with a fall today. Pt hit the back of her head but otherwise has no complaints. Pt in NAD.

## 2019-12-23 ENCOUNTER — Telehealth: Payer: Self-pay | Admitting: Internal Medicine

## 2019-12-23 MED ORDER — NAPROXEN SODIUM 220 MG PO TABS: 440.0000 mg | ORAL_TABLET | Freq: Two times a day (BID) | ORAL | 0 refills | Status: AC | PRN

## 2019-12-23 NOTE — Telephone Encounter (Signed)
PC with Angela----med tech at --[facility]  She fell and hit her head and back pretty hard Staples placed in head Having more problems with pain---more in back then head Activated tylenol standing orders but not enough  Discussed heat Reviewed meds---aleve in past and normal GFR Will send Rx for aleve 2 tabs bid prn (#30 x 0)--though they won't be able to get it till morning

## 2019-12-24 ENCOUNTER — Other Ambulatory Visit: Payer: Self-pay

## 2019-12-24 ENCOUNTER — Emergency Department
Admission: EM | Admit: 2019-12-24 | Discharge: 2019-12-24 | Disposition: A | Discharge status: Skilled Nursing Facility | Payer: Medicare Other | Attending: Student in an Organized Health Care Education/Training Program | Admitting: Student in an Organized Health Care Education/Training Program

## 2019-12-24 ENCOUNTER — Emergency Department: Payer: Medicare Other

## 2019-12-24 DIAGNOSIS — Z888 Allergy status to other drugs, medicaments and biological substances status: Secondary | ICD-10-CM | POA: Diagnosis not present

## 2019-12-24 DIAGNOSIS — E7800 Pure hypercholesterolemia, unspecified: Secondary | ICD-10-CM | POA: Diagnosis not present

## 2019-12-24 DIAGNOSIS — Y939 Activity, unspecified: Secondary | ICD-10-CM | POA: Insufficient documentation

## 2019-12-24 DIAGNOSIS — Z7982 Long term (current) use of aspirin: Secondary | ICD-10-CM | POA: Insufficient documentation

## 2019-12-24 DIAGNOSIS — R739 Hyperglycemia, unspecified: Secondary | ICD-10-CM | POA: Diagnosis not present

## 2019-12-24 DIAGNOSIS — W06XXXA Fall from bed, initial encounter: Secondary | ICD-10-CM | POA: Insufficient documentation

## 2019-12-24 DIAGNOSIS — M542 Cervicalgia: Secondary | ICD-10-CM | POA: Diagnosis not present

## 2019-12-24 DIAGNOSIS — Y92003 Bedroom of unspecified non-institutional (private) residence as the place of occurrence of the external cause: Secondary | ICD-10-CM | POA: Insufficient documentation

## 2019-12-24 DIAGNOSIS — S0990XA Unspecified injury of head, initial encounter: Secondary | ICD-10-CM | POA: Insufficient documentation

## 2019-12-24 DIAGNOSIS — C73 Malignant neoplasm of thyroid gland: Secondary | ICD-10-CM | POA: Diagnosis not present

## 2019-12-24 DIAGNOSIS — Z79899 Other long term (current) drug therapy: Secondary | ICD-10-CM | POA: Insufficient documentation

## 2019-12-24 DIAGNOSIS — Z801 Family history of malignant neoplasm of trachea, bronchus and lung: Secondary | ICD-10-CM | POA: Insufficient documentation

## 2019-12-24 DIAGNOSIS — R519 Headache, unspecified: Secondary | ICD-10-CM | POA: Insufficient documentation

## 2019-12-24 DIAGNOSIS — Y999 Unspecified external cause status: Secondary | ICD-10-CM | POA: Insufficient documentation

## 2019-12-24 DIAGNOSIS — W19XXXA Unspecified fall, initial encounter: Secondary | ICD-10-CM

## 2019-12-24 NOTE — ED Notes (Signed)
Patient transported to CT 

## 2019-12-24 NOTE — ED Notes (Signed)
Pt's son/POA called to speak with this RN at 0500am. This RN informed him of pt's departure back to [facility]. Pt's son was concerned about the amount of pain that the pt exhibited back at [facility] r/t her recent fall from a few days ago (at 10pm) and during the fall tonight (around 2am)/ This RN explained to the pt's son that based on the dementia pain scale and lack of verbal vocalization of pain from said pt, that during her time in the ER she was no indication to medicate her. Pt's son verbally upset advocating for a stronger pain relief method aside from the Tylenol that [facility] provides to her since her current physican is un-reachable due to the holiday weekend. This RN explained to pt's son after speaking with EDP Quentin Cornwall, that because she did not show any signs of pain (via PAINAD/ vital signs/ verbal vocalization) she was not given any prescriptions for pain medicine and due to her discharge from the ED that we would not be able to accommodate his request. EDP Notified. No new concerns or questions posed to this RN

## 2019-12-24 NOTE — ED Triage Notes (Signed)
Pt from [facility]. Per EMS pt fell several days ago, hit head, came to [facility] ER, had staples placed in back of head.  Pt fell again tonight,unwitnessed, from bed.  Ems found pt on floor with head near baseboard, pt states that she hit back of head.  Staples intact, no fresh bleeding. Pt alert, oriented to self and situation, disoriented to time. Pt denies LOC.

## 2019-12-24 NOTE — ED Provider Notes (Signed)
[facility] Emergency Department Provider Note    None    (approximate)  I have reviewed the triage vital signs and the nursing notes.   HISTORY  Chief Complaint Fall    HPI Paige Farmer is a 86 y.o. female with the below listed past medical history presents from [place] for fall.  Reportedly was trying to slide out of bed.  Denies any pain.  Was recently seen for fall with laceration and staples placed.  She states that she does have some neck pain.  No chest pain or shortness of breath.    Past Medical History:  Diagnosis Date  . Cancer ([place])    Thyroid  . Dementia ([place])   . Depression   . Glaucoma   . H/O: depression   . History of chicken pox   . Palpitations   . Thyroid cancer ([place])   . Thyroid disease    Family History  Problem Relation Age of Onset  . Cancer Mother        multiple myeloma  . Parkinson's disease Father   . Cancer - Lung Brother        x 2  . Breast cancer Neg Hx    Past Surgical History:  Procedure Laterality Date  . ABDOMINAL HYSTERECTOMY    . APPENDECTOMY    . LAPAROSCOPIC HYSTERECTOMY  1970   ovaries left in place  . THYROIDECTOMY    . TONSILLECTOMY  1952   Patient Active Problem List   Diagnosis Date Noted  . Infection of left tear duct 05/30/2019  . Memory change 04/20/2016  . Elevated blood pressure reading 04/20/2016  . Hyperglycemia 10/03/2015  . Osteopenia 07/18/2015  . Disease of thyroid gland 07/18/2015  . Loss of weight 04/16/2015  . Health care maintenance 01/13/2015  . Beat, premature ventricular 09/11/2014  . Abdominal cramping 08/09/2014  . Acute diarrhea 08/09/2014  . Abnormal liver enzymes 08/09/2014  . Lump in neck 05/19/2014  . Localized swelling, mass, and lump of head 05/19/2014  . Abnormal liver function tests 05/14/2014  . Edema 05/14/2014  . Abnormal blood chemistry 05/14/2014  . Hypercholesterolemia 03/14/2014  . Pure hypercholesterolemia 03/14/2014  . Constipation  01/04/2014  . Anemia 01/04/2014  . Palpitations 10/01/2013  . Glaucoma 10/01/2013  . Depression 10/01/2013  . Head pain 10/01/2013  . Right hip pain 10/01/2013  . Cephalalgia 10/01/2013  . Malignant neoplasm of thyroid gland ([place]) 10/01/2013  . Arthralgia of hip or thigh 10/01/2013  . Degenerative arthritis of hip 07/28/2012  . HZV (herpes zoster virus) post herpetic neuralgia 03/23/2012  . Fatigue 12/22/2011      Prior to Admission medications   Medication Sig Start Date End Date Taking? Authorizing Provider  ASPIRIN LOW DOSE 81 MG EC tablet TAKE 1 TABLET BY MOUTH ONCE DAILY Patient taking differently: Take 81 mg by mouth daily.  09/01/19   Einar Pheasant, MD  busPIRone (BUSPAR) 10 MG tablet TAKE 1 TABLET BY MOUTH TWICE A DAY Patient taking differently: Take 10 mg by mouth 2 (two) times daily.  09/01/19   Einar Pheasant, MD  dorzolamide-timolol (COSOPT) 22.3-6.8 MG/ML ophthalmic solution INSTILL 1 DROP IN [facility] EYE TWICE DAILY Patient taking differently: Place 1 drop into both eyes 2 (two) times daily.  09/01/19   Einar Pheasant, MD  hydrocortisone 2.5 % ointment Apply 1 application topically 2 (two) times daily. 11/22/19   [provider]  levothyroxine (SYNTHROID) 75 MCG tablet TAKE 1 TABLET BY MOUTH AT BEDTIME  ON AN EMPTY STOMACH Patient taking differently: Take 75 mcg by mouth at bedtime. TAKE 1 TABLET BY MOUTH AT BEDTIME ON AN EMPTY STOMACH 09/01/19   Einar Pheasant, MD  liothyronine (CYTOMEL) 5 MCG tablet TAKE 1 & 1/2 TABLETS BY MOUTH ONCE DAILY Patient taking differently: Take 7.5 mcg by mouth daily. TAKE 1 & 1/2 TABLETS (7.5 mcg) BY MOUTH ONCE DAILY 09/01/19   Einar Pheasant, MD  melatonin 3 MG TABS tablet Take 6 mg by mouth at bedtime.     [provider]  Multiple Vitamins-Minerals (CERTAVITE SENIOR) TABS TAKE 1 TABLET BY MOUTH AT BEDTIME 09/01/19   Einar Pheasant, MD  Multiple Vitamins-Minerals (PRESERVISION AREDS) TABS TAKE 1 TABLET BY MOUTH TWICE A DAY  09/01/19   Einar Pheasant, MD  naproxen sodium (ALEVE) 220 MG tablet Take 2 tablets (440 mg total) by mouth 2 (two) times daily as needed. 12/23/19   Venia Carbon, MD  sertraline (ZOLOFT) 100 MG tablet TAKE 1 TABLET BY MOUTH ONCE DAILY Patient taking differently: Take 100 mg by mouth daily.  09/01/19   Einar Pheasant, MD    Allergies Doxepin    Social History Social History   Tobacco Use  . Smoking status: Never Smoker  . Smokeless tobacco: Never Used  Vaping Use  . Vaping Use: Never used  Substance Use Topics  . Alcohol use: No    Alcohol/week: 0.0 standard drinks  . Drug use: No    Review of Systems Patient denies headaches, rhinorrhea, blurry vision, numbness, shortness of breath, chest pain, edema, cough, abdominal pain, nausea, vomiting, diarrhea, dysuria, fevers, rashes or hallucinations unless otherwise stated above in HPI. ____________________________________________   PHYSICAL EXAM:  VITAL SIGNS: Vitals:   12/24/19 0217 12/24/19 0245  BP: (!) 179/67 (!) 179/74  Pulse: 70 73  Resp: 18 19  Temp: 98.9 F (37.2 C)   SpO2: 99% 99%    Constitutional: Alert, pleasant and in NAD.  Eyes: Conjunctivae are normal.  Head: Atraumatic. Nose: No congestion/rhinnorhea. Mouth/Throat: Mucous membranes are moist.   Neck: No stridor. Painless ROM.  Cardiovascular: Normal rate, regular rhythm. Grossly normal heart sounds.  Good peripheral circulation. Respiratory: Normal respiratory effort.  No retractions. Lungs CTAB. Gastrointestinal: Soft and nontender. No distention. No abdominal bruits. No CVA tenderness. Genitourinary:  Musculoskeletal: No lower extremity tenderness nor edema.  No joint effusions. Neurologic:  Normal speech and language. No gross focal neurologic deficits are appreciated. No facial droop Skin:  Skin is warm, dry and intact. No rash noted. Psychiatric: Mood and affect are normal. Speech and behavior are  normal.  ____________________________________________   LABS (all labs ordered are listed, but only abnormal results are displayed)  No results found for this or any previous visit (from the past 24 hour(s)). ____________________________________________ RADIOLOGY  I personally reviewed all radiographic images ordered to evaluate for the above acute complaints and reviewed radiology reports and findings.  These findings were personally discussed with the patient.  Please see medical record for radiology report.  ____________________________________________   PROCEDURES  Procedure(s) performed:  Procedures    Critical Care performed: no ____________________________________________   INITIAL IMPRESSION / ASSESSMENT AND PLAN / ED COURSE  Pertinent labs & imaging results that were available during my care of the patient were reviewed by me and considered in my medical decision making (see chart for details).   DDX: fracture, contusion, laceration  Paige Farmer is a 86 y.o. who presents to the ED with fall as described above complaint of neck pain  and minor head injury.  Staples appear in appropriate position.  No new laceration.  Will order CT imaging given her age and risk factors.  Clinical Course as of Dec 24 326  Sun Dec 24, 2019  1700 CT imaging fortunately does not show any evidence of fracture or acute intracranial abnormality.  Have discussed with the patient and available family all diagnostics and treatments performed thus far and all questions were answered to the best of my ability. The patient demonstrates understanding and agreement with plan.    [PR]    Clinical Course User Index [PR] Merlyn Lot, MD    The patient was evaluated in Emergency Department today for the symptoms described in the history of present illness. He/she was evaluated in the context of the global COVID-19 pandemic, which necessitated consideration that the patient might be at risk  for infection with the SARS-CoV-2 virus that causes COVID-19. Institutional protocols and algorithms that pertain to the evaluation of patients at risk for COVID-19 are in a state of rapid change based on information released by regulatory bodies including the CDC and federal and state organizations. These policies and algorithms were followed during the patient's care in the ED.  As part of my medical decision making, I reviewed the following data within the [place] notes reviewed and incorporated, Labs reviewed, notes from prior ED visits and [place] Controlled Substance Database   ____________________________________________   FINAL CLINICAL IMPRESSION(S) / ED DIAGNOSES  Final diagnoses:  Fall, initial encounter      NEW MEDICATIONS STARTED DURING THIS VISIT:  New Prescriptions   No medications on file     Note:  This document was prepared using Dragon voice recognition software and may include unintentional dictation errors.    Merlyn Lot, MD 12/24/19 [phone]

## 2019-12-24 NOTE — ED Notes (Signed)
This RN gave report over to a staff member at [facility]. No other questions or concerns were posed to this RN. Facility notified that pt will be on the way back to [facility] when EMS transport is avaliable.

## 2019-12-24 NOTE — ED Notes (Signed)
Updated pt's son, Aliviya Schoeller. Pt given phone to speak to son.

## 2019-12-24 NOTE — ED Notes (Signed)
Waiting for EMS transport at this time

## 2019-12-24 NOTE — ED Notes (Signed)
Ems here for transport

## 2019-12-26 ENCOUNTER — Telehealth: Payer: Self-pay | Admitting: Internal Medicine

## 2019-12-26 NOTE — Telephone Encounter (Signed)
See other phone message.  They spoke to Dr Silvio Pate over the weekend.  Need update on how pt is doing.

## 2019-12-26 NOTE — Telephone Encounter (Signed)
facility called [facility] on 12/23/19 at 948 pm Complaint of pain to back of head from staples to back of head  from fall on 12/21/19. Per chart verified patient seen at ER on 12/24/19, DX with no acute fracture or intracranial bleed was DC back to facility. NO new medications.

## 2019-12-26 NOTE — Telephone Encounter (Signed)
Called [facility]. Confirmed patient is doing better. Using Aleve prn for back pain. Staples are due to come out 7/8 or 7/9. Med tech is going to check with RN and see if staples can be removed at the facility. If not, son is going to see if dermatology can take them out while she is there for her appt on Thursday.

## 2019-12-26 NOTE — Telephone Encounter (Signed)
Please call and confirm pt doing ok.  Per review of chart, she has fallen two times in the last several days.  Has been to ER on two separate occasions.

## 2019-12-27 NOTE — Telephone Encounter (Signed)
Ok for change in order to say naproxen 220mg  - take two tablets bid.  Does she need a new rx sent in or just changing directions?  Also, she had staples placed.  Does she need appt here for evaluation and staple removal.

## 2019-12-27 NOTE — Telephone Encounter (Signed)
Paige Farmer. Unable to reach

## 2019-12-27 NOTE — Telephone Encounter (Signed)
[facility] called wanted sign order has soon as possible for pt please

## 2019-12-27 NOTE — Telephone Encounter (Signed)
Patients son stated patient is in px, she would need to have an updated rx of naproxen. The original says PRN but patient is unable to always communicate when she is in pain so she hasn't been getting the medication for [place]. They were wondering if new rx of naproxen can be called in to give a more clearer medication directions besides PRN. Patient son is going to be addressing with facility about some other issues as well. Patient son would like to have his mother out of pain, but no opioids due to her cognitive state and her unsteadiness.

## 2019-12-29 ENCOUNTER — Telehealth: Payer: Self-pay | Admitting: Internal Medicine

## 2019-12-29 NOTE — Telephone Encounter (Signed)
Order for naproxen to be scheduled faxed. Typed letter for staple removal, rollator walker, and PT evaluation to treat. Spoke with Janene Madeira. She will be removing staples. She is aware orders are being sent.

## 2019-12-29 NOTE — Telephone Encounter (Signed)
This has already been taken care of. See other phone note

## 2019-12-29 NOTE — Telephone Encounter (Signed)
Pt son called to see if the order had been sent

## 2019-12-29 NOTE — Telephone Encounter (Signed)
Paige Farmer from [facility] called need to speak with you [phone]

## 2020-03-06 ENCOUNTER — Other Ambulatory Visit: Payer: Medicare Other

## 2020-03-06 ENCOUNTER — Ambulatory Visit: Payer: Medicare Other | Admitting: Internal Medicine

## 2020-03-14 ENCOUNTER — Other Ambulatory Visit: Payer: Self-pay

## 2020-03-14 ENCOUNTER — Emergency Department: Payer: Medicare Other

## 2020-03-14 ENCOUNTER — Emergency Department
Admission: EM | Admit: 2020-03-14 | Discharge: 2020-03-14 | Disposition: A | Discharge status: Home / Self Care | Payer: Medicare Other | Attending: Emergency Medicine | Admitting: Emergency Medicine

## 2020-03-14 ENCOUNTER — Encounter: Payer: Self-pay | Admitting: *Deleted

## 2020-03-14 DIAGNOSIS — Z85850 Personal history of malignant neoplasm of thyroid: Secondary | ICD-10-CM | POA: Diagnosis not present

## 2020-03-14 DIAGNOSIS — Z7982 Long term (current) use of aspirin: Secondary | ICD-10-CM | POA: Diagnosis not present

## 2020-03-14 DIAGNOSIS — S0990XA Unspecified injury of head, initial encounter: Secondary | ICD-10-CM

## 2020-03-14 DIAGNOSIS — W19XXXA Unspecified fall, initial encounter: Secondary | ICD-10-CM

## 2020-03-14 DIAGNOSIS — F0390 Unspecified dementia without behavioral disturbance: Secondary | ICD-10-CM | POA: Insufficient documentation

## 2020-03-14 DIAGNOSIS — W01198A Fall on same level from slipping, tripping and stumbling with subsequent striking against other object, initial encounter: Secondary | ICD-10-CM | POA: Insufficient documentation

## 2020-03-14 NOTE — ED Provider Notes (Signed)
[facility] Emergency Department Provider Note  ____________________________________________  Time seen: Approximately 7:43 PM  I have reviewed the triage vital signs and the nursing notes.   HISTORY  Chief Complaint Fall    HPI Paige Farmer is a 86 y.o. female who presents emergency department via EMS for a fall. Patient evidently tripped with her walker, fell striking her head. Patient states that initially she did have a headache, she denies any headache currently. She states that she has no other complaints and would like to go home at this time. Reportedly patient fell struck the occipital skull region. No reported lacerations. Patient has a history of thyroid cancer, depression, dementia, glaucoma, anemia.         Past Medical History:  Diagnosis Date  . Cancer ([facility])    Thyroid  . Dementia ([facility])   . Depression   . Glaucoma   . H/O: depression   . History of chicken pox   . Palpitations   . Thyroid cancer ([facility])   . Thyroid disease     Patient Active Problem List   Diagnosis Date Noted  . Infection of left tear duct 05/30/2019  . Memory change 04/20/2016  . Elevated blood pressure reading 04/20/2016  . Hyperglycemia 10/03/2015  . Osteopenia 07/18/2015  . Disease of thyroid gland 07/18/2015  . Loss of weight 04/16/2015  . Health care maintenance 01/13/2015  . Beat, premature ventricular 09/11/2014  . Abdominal cramping 08/09/2014  . Acute diarrhea 08/09/2014  . Abnormal liver enzymes 08/09/2014  . Lump in neck 05/19/2014  . Localized swelling, mass, and lump of head 05/19/2014  . Abnormal liver function tests 05/14/2014  . Edema 05/14/2014  . Abnormal blood chemistry 05/14/2014  . Hypercholesterolemia 03/14/2014  . Pure hypercholesterolemia 03/14/2014  . Constipation 01/04/2014  . Anemia 01/04/2014  . Palpitations 10/01/2013  . Glaucoma 10/01/2013  . Depression 10/01/2013  . Head pain 10/01/2013  . Right hip pain 10/01/2013   . Cephalalgia 10/01/2013  . Malignant neoplasm of thyroid gland (H[facility]) 10/01/2013  . Arthralgia of hip or thigh 10/01/2013  . Degenerative arthritis of hip 07/28/2012  . HZV (herpes zoster virus) post herpetic neuralgia 03/23/2012  . Fatigue 12/22/2011    Past Surgical History:  Procedure Laterality Date  . ABDOMINAL HYSTERECTOMY    . APPENDECTOMY    . LAPAROSCOPIC HYSTERECTOMY  1970   ovaries left in place  . THYROIDECTOMY    . TONSILLECTOMY  1952    Prior to Admission medications   Medication Sig Start Date End Date Taking? Authorizing Provider  ASPIRIN LOW DOSE 81 MG EC tablet TAKE 1 TABLET BY MOUTH ONCE DAILY Patient taking differently: Take 81 mg by mouth daily.  09/01/19   Dale Metamora, MD  busPIRone (BUSPAR) 10 MG tablet TAKE 1 TABLET BY MOUTH TWICE A DAY Patient taking differently: Take 10 mg by mouth 2 (two) times daily.  09/01/19   Dale [place], MD  dorzolamide-timolol (COSOPT) 22.3-6.8 MG/ML ophthalmic solution INSTILL 1 DROP IN [facility] EYE TWICE DAILY Patient taking differently: Place 1 drop into both eyes 2 (two) times daily.  09/01/19   Dale Clayton, MD  hydrocortisone 2.5 % ointment Apply 1 application topically 2 (two) times daily. 11/22/19   [provider]  levothyroxine (SYNTHROID) 75 MCG tablet TAKE 1 TABLET BY MOUTH AT BEDTIME ON AN EMPTY STOMACH Patient taking differently: Take 75 mcg by mouth at bedtime. TAKE 1 TABLET BY MOUTH AT BEDTIME ON AN EMPTY STOMACH 09/01/19   Dale Monona,  MD  liothyronine (CYTOMEL) 5 MCG tablet TAKE 1 & 1/2 TABLETS BY MOUTH ONCE DAILY Patient taking differently: Take 7.5 mcg by mouth daily. TAKE 1 & 1/2 TABLETS (7.5 mcg) BY MOUTH ONCE DAILY 09/01/19   Dale Palermo, MD  melatonin 3 MG TABS tablet Take 6 mg by mouth at bedtime.     [provider]  Multiple Vitamins-Minerals (CERTAVITE SENIOR) TABS TAKE 1 TABLET BY MOUTH AT BEDTIME 09/01/19   Dale Linton, MD  Multiple Vitamins-Minerals (PRESERVISION AREDS)  TABS TAKE 1 TABLET BY MOUTH TWICE A DAY 09/01/19   Dale Bison, MD  naproxen sodium (ALEVE) 220 MG tablet Take 2 tablets (440 mg total) by mouth 2 (two) times daily as needed. 12/23/19   Karie Schwalbe, MD  sertraline (ZOLOFT) 100 MG tablet TAKE 1 TABLET BY MOUTH ONCE DAILY Patient taking differently: Take 100 mg by mouth daily.  09/01/19   Dale [place], MD    Allergies Doxepin  Family History  Problem Relation Age of Onset  . Cancer Mother        multiple myeloma  . Parkinson's disease Father   . Cancer - Lung Brother        x 2  . Breast cancer Neg Hx     Social History Social History   Tobacco Use  . Smoking status: Never Smoker  . Smokeless tobacco: Never Used  Vaping Use  . Vaping Use: Never used  Substance Use Topics  . Alcohol use: No    Alcohol/week: 0.0 standard drinks  . Drug use: No     Review of Systems  Constitutional: No fever/chills patient sustained a mechanical fall striking the occipital skull. Eyes: No visual changes. No discharge ENT: No upper respiratory complaints. Cardiovascular: no chest pain. Respiratory: no cough. No SOB. Gastrointestinal: No abdominal pain.  No nausea, no vomiting.  No diarrhea.  No constipation. Musculoskeletal: Negative for musculoskeletal pain. Skin: Negative for rash, abrasions, lacerations, ecchymosis. Neurological: Negative for headaches, focal weakness or numbness. 10-point ROS otherwise negative.  ____________________________________________   PHYSICAL EXAM:  VITAL SIGNS: ED Triage Vitals  Enc Vitals Group     BP 03/14/20 1729 (!) 147/102     Pulse Rate 03/14/20 1729 84     Resp 03/14/20 1729 20     Temp 03/14/20 1729 98.9 F (37.2 C)     Temp Source 03/14/20 1729 Oral     SpO2 03/14/20 1729 100 %     Weight 03/14/20 1730 135 lb (61.2 kg)     Height 03/14/20 1730 5\' 2"  (1.575 m)     Head Circumference --      Peak Flow --      Pain Score 03/14/20 1730 5     Pain Loc --      Pain Edu? --       Excl. in GC? --      Constitutional: Alert and oriented. Well appearing and in no acute distress. Eyes: Conjunctivae are normal. PERRL. EOMI. Head: Small hematoma noted to the occipital region. No lacerations or abrasions. Area appears nontender to palpation. No palpable crepitus. No other visible signs of trauma to the skull or face. No battle signs, raccoon eyes, serosanguineous fluid drainage from the ears or nares ENT:      Ears:       Nose: No congestion/rhinnorhea.      Mouth/Throat: Mucous membranes are moist.  Neck: No stridor.  No cervical spine tenderness to palpation.  Cardiovascular: Normal rate, regular rhythm. Normal S1  and S2.  Good peripheral circulation. Respiratory: Normal respiratory effort without tachypnea or retractions. Lungs CTAB. Good air entry to the bases with no decreased or absent breath sounds. Musculoskeletal: Full range of motion to all extremities. No gross deformities appreciated. Neurologic:  Normal speech and language. No gross focal neurologic deficits are appreciated.  Skin:  Skin is warm, dry and intact. No rash noted. Psychiatric: Mood and affect are normal. Speech and behavior are normal. Patient exhibits appropriate insight and judgement. Patient does have a history of dementia. She answers questions appropriately at this time.   ____________________________________________   LABS (all labs ordered are listed, but only abnormal results are displayed)  Labs Reviewed - No data to display ____________________________________________  EKG   ____________________________________________  RADIOLOGY I personally viewed and evaluated these images as part of my medical decision making, as well as reviewing the written report by the radiologist.  CT Head Wo Contrast  Result Date: 03/14/2020 CLINICAL DATA:  [place], occipital scalp hematoma, headache EXAM: CT HEAD WITHOUT CONTRAST TECHNIQUE: Contiguous axial images were obtained from the base of the  skull through the vertex without intravenous contrast. COMPARISON:  12/24/2019 FINDINGS: Brain: Confluent hypodensities throughout the periventricular white matter unchanged consistent with chronic small vessel ischemic change. No signs of acute infarct or hemorrhage. Lateral ventricles and midline structures are stable. No acute extra-axial fluid collections. No mass effect. Vascular: No hyperdense vessel or unexpected calcification. Skull: Normal. Negative for fracture or focal lesion. Sinuses/Orbits: No acute finding. Other: None. IMPRESSION: Stable chronic small vessel ischemic changes. No acute intracranial process. Electronically Signed   By: Randa Ngo M.D.   On: 03/14/2020 18:16   CT Cervical Spine Wo Contrast  Result Date: 03/14/2020 CLINICAL DATA:  [place], headache, occipital hematoma EXAM: CT CERVICAL SPINE WITHOUT CONTRAST TECHNIQUE: Multidetector CT imaging of the cervical spine was performed without intravenous contrast. Multiplanar CT image reconstructions were also generated. COMPARISON:  12/24/2019 FINDINGS: Alignment: Alignment is anatomic. Skull base and vertebrae: No acute displaced fracture. Soft tissues and spinal canal: No prevertebral fluid or swelling. No visible canal hematoma. Extensive atherosclerosis at the carotid bifurcations. Disc levels: Stable multilevel spondylosis greatest at C3-4 and C4-5. Upper chest: Airway is patent.  Lung apices are clear. Other: Reconstructed images demonstrate no additional findings. IMPRESSION: 1. Stable cervical spondylosis.  No acute fracture. Electronically Signed   By: Randa Ngo M.D.   On: 03/14/2020 18:25    ____________________________________________    PROCEDURES  Procedure(s) performed:    Procedures    Medications - No data to display   ____________________________________________   INITIAL IMPRESSION / ASSESSMENT AND PLAN / ED COURSE  Pertinent labs & imaging results that were available during my care of the  patient were reviewed by me and considered in my medical decision making (see chart for details).  Review of the [place] CSRS was performed in accordance of the [place] prior to dispensing any controlled drugs.           Patient's diagnosis is consistent with fall, minor head injury. Patient presented to the emergency department for evaluation after she tripped over her walker striking the occipital skull. No loss of consciousness was reported. Patient states that initially she had a headache but denies any currently. She does have a history of dementia but is answering questions appropriately at this time. Imaging reveals no acute traumatic findings of intracranial hemorrhage or skull fracture. At this time patient is stable for discharge. She has no other complaints at this time.. Follow-up  with primary care as needed. Patient is given ED precautions to return to the ED for any worsening or new symptoms.     ____________________________________________  FINAL CLINICAL IMPRESSION(S) / ED DIAGNOSES  Final diagnoses:  None      NEW MEDICATIONS STARTED DURING THIS VISIT:  ED Discharge Orders    None          This chart was dictated using voice recognition software/Dragon. Despite best efforts to proofread, errors can occur which can change the meaning. Any change was purely unintentional.    Darletta Moll, PA-C 03/14/20 2019    Nance Pear, MD 03/14/20 2244

## 2020-03-14 NOTE — ED Triage Notes (Addendum)
Pt brought in via ems from [facility].  Pt fell with her walker and has a hematoma to back of head.  Pt has a headache.  Denies neck or back pain.   No loc  No vomiting.  Pt alert.

## 2020-03-14 NOTE — ED Notes (Signed)
Pt walked to bathroom independently.

## 2020-03-20 ENCOUNTER — Emergency Department: Payer: Medicare Other

## 2020-03-20 ENCOUNTER — Emergency Department
Admission: EM | Admit: 2020-03-20 | Discharge: 2020-03-20 | Disposition: A | Discharge status: Home / Self Care | Payer: Medicare Other | Attending: Emergency Medicine | Admitting: Emergency Medicine

## 2020-03-20 DIAGNOSIS — M25559 Pain in unspecified hip: Secondary | ICD-10-CM

## 2020-03-20 DIAGNOSIS — Z79890 Hormone replacement therapy (postmenopausal): Secondary | ICD-10-CM | POA: Diagnosis not present

## 2020-03-20 DIAGNOSIS — C73 Malignant neoplasm of thyroid gland: Secondary | ICD-10-CM | POA: Diagnosis not present

## 2020-03-20 DIAGNOSIS — Z7982 Long term (current) use of aspirin: Secondary | ICD-10-CM | POA: Diagnosis not present

## 2020-03-20 DIAGNOSIS — M25552 Pain in left hip: Secondary | ICD-10-CM | POA: Diagnosis present

## 2020-03-20 DIAGNOSIS — Z79899 Other long term (current) drug therapy: Secondary | ICD-10-CM | POA: Diagnosis not present

## 2020-03-20 DIAGNOSIS — E079 Disorder of thyroid, unspecified: Secondary | ICD-10-CM | POA: Diagnosis not present

## 2020-03-20 DIAGNOSIS — W19XXXA Unspecified fall, initial encounter: Secondary | ICD-10-CM | POA: Diagnosis not present

## 2020-03-20 DIAGNOSIS — I10 Essential (primary) hypertension: Secondary | ICD-10-CM | POA: Diagnosis not present

## 2020-03-20 MED ORDER — TRAMADOL HCL 50 MG PO TABS: 50.0000 mg | ORAL_TABLET | Freq: Four times a day (QID) | ORAL | 0 refills | Status: AC | PRN

## 2020-03-20 MED ORDER — ACETAMINOPHEN 325 MG PO TABS: 650.0000 mg | ORAL_TABLET | Freq: Four times a day (QID) | ORAL | 2 refills | Status: AC | PRN

## 2020-03-20 NOTE — ED Triage Notes (Signed)
Per EMS, pt experiences a mechanical fall at SNF, c/o left hip pain. +PMS and full ROM

## 2020-03-20 NOTE — ED Provider Notes (Signed)
[facility] Emergency Department Provider Note  Time seen: 1:39 PM  I have reviewed the triage vital signs and the nursing notes.   HISTORY  Chief Complaint Hip Pain and Fall   HPI Paige Farmer is a 86 y.o. female with a past medical history of dementia, hypertension, presents to the emergency department after a fall at her nursing facility.  According to EMS the patient had a fall this morning in her nursing facility which is not uncommon however later this morning she was complaining of left hip pain so they sent the patient to the emergency department for evaluation.  Here patient remembers the fall and is complaining of mild left hip pain but is able to freely range the hip.  Patient believes she may have hit her head but does not believe she passed out.  Denies any other pain complaints.   Past Medical History:  Diagnosis Date  . Cancer ([place])    Thyroid  . Dementia ([place])   . Depression   . Glaucoma   . H/O: depression   . History of chicken pox   . Palpitations   . Thyroid cancer ([place])   . Thyroid disease     Patient Active Problem List   Diagnosis Date Noted  . Infection of left tear duct 05/30/2019  . Memory change 04/20/2016  . Elevated blood pressure reading 04/20/2016  . Hyperglycemia 10/03/2015  . Osteopenia 07/18/2015  . Disease of thyroid gland 07/18/2015  . Loss of weight 04/16/2015  . Health care maintenance 01/13/2015  . Beat, premature ventricular 09/11/2014  . Abdominal cramping 08/09/2014  . Acute diarrhea 08/09/2014  . Abnormal liver enzymes 08/09/2014  . Lump in neck 05/19/2014  . Localized swelling, mass, and lump of head 05/19/2014  . Abnormal liver function tests 05/14/2014  . Edema 05/14/2014  . Abnormal blood chemistry 05/14/2014  . Hypercholesterolemia 03/14/2014  . Pure hypercholesterolemia 03/14/2014  . Constipation 01/04/2014  . Anemia 01/04/2014  . Palpitations 10/01/2013  . Glaucoma 10/01/2013  . Depression  10/01/2013  . Head pain 10/01/2013  . Right hip pain 10/01/2013  . Cephalalgia 10/01/2013  . Malignant neoplasm of thyroid gland ([place]) 10/01/2013  . Arthralgia of hip or thigh 10/01/2013  . Degenerative arthritis of hip 07/28/2012  . HZV (herpes zoster virus) post herpetic neuralgia 03/23/2012  . Fatigue 12/22/2011    Past Surgical History:  Procedure Laterality Date  . ABDOMINAL HYSTERECTOMY    . APPENDECTOMY    . LAPAROSCOPIC HYSTERECTOMY  1970   ovaries left in place  . THYROIDECTOMY    . TONSILLECTOMY  1952    Prior to Admission medications   Medication Sig Start Date End Date Taking? Authorizing Provider  ASPIRIN LOW DOSE 81 MG EC tablet TAKE 1 TABLET BY MOUTH ONCE DAILY Patient taking differently: Take 81 mg by mouth daily.  09/01/19   Einar Pheasant, MD  busPIRone (BUSPAR) 10 MG tablet TAKE 1 TABLET BY MOUTH TWICE A DAY Patient taking differently: Take 10 mg by mouth 2 (two) times daily.  09/01/19   Einar Pheasant, MD  dorzolamide-timolol (COSOPT) 22.3-6.8 MG/ML ophthalmic solution INSTILL 1 DROP IN [facility] EYE TWICE DAILY Patient taking differently: Place 1 drop into both eyes 2 (two) times daily.  09/01/19   Einar Pheasant, MD  hydrocortisone 2.5 % ointment Apply 1 application topically 2 (two) times daily. 11/22/19   [provider]  levothyroxine (SYNTHROID) 75 MCG tablet TAKE 1 TABLET BY MOUTH AT BEDTIME ON AN EMPTY STOMACH  Patient taking differently: Take 75 mcg by mouth at bedtime. TAKE 1 TABLET BY MOUTH AT BEDTIME ON AN EMPTY STOMACH 09/01/19   Einar Pheasant, MD  liothyronine (CYTOMEL) 5 MCG tablet TAKE 1 & 1/2 TABLETS BY MOUTH ONCE DAILY Patient taking differently: Take 7.5 mcg by mouth daily. TAKE 1 & 1/2 TABLETS (7.5 mcg) BY MOUTH ONCE DAILY 09/01/19   Einar Pheasant, MD  melatonin 3 MG TABS tablet Take 6 mg by mouth at bedtime.     [provider]  Multiple Vitamins-Minerals (CERTAVITE SENIOR) TABS TAKE 1 TABLET BY MOUTH AT BEDTIME 09/01/19   Einar Pheasant, MD  Multiple Vitamins-Minerals (PRESERVISION AREDS) TABS TAKE 1 TABLET BY MOUTH TWICE A DAY 09/01/19   Einar Pheasant, MD  naproxen sodium (ALEVE) 220 MG tablet Take 2 tablets (440 mg total) by mouth 2 (two) times daily as needed. 12/23/19   Venia Carbon, MD  sertraline (ZOLOFT) 100 MG tablet TAKE 1 TABLET BY MOUTH ONCE DAILY Patient taking differently: Take 100 mg by mouth daily.  09/01/19   Einar Pheasant, MD    Allergies  Allergen Reactions  . Doxepin Nausea Only    Family History  Problem Relation Age of Onset  . Cancer Mother        multiple myeloma  . Parkinson's disease Father   . Cancer - Lung Brother        x 2  . Breast cancer Neg Hx     Social History Social History   Tobacco Use  . Smoking status: Never Smoker  . Smokeless tobacco: Never Used  Vaping Use  . Vaping Use: Never used  Substance Use Topics  . Alcohol use: No    Alcohol/week: 0.0 standard drinks  . Drug use: No    Review of Systems Constitutional: Negative for loss of consciousness Cardiovascular: Negative for chest pain. Respiratory: Negative for shortness of breath. Gastrointestinal: Negative for abdominal pain Musculoskeletal: Left hip pain Neurological: Negative for headache All other ROS negative, although possibly limited by baseline dementia.  ____________________________________________   PHYSICAL EXAM:  VITAL SIGNS: ED Triage Vitals  Enc Vitals Group     BP 03/20/20 1317 (!) 143/87     Pulse Rate 03/20/20 1317 82     Resp 03/20/20 1317 17     Temp 03/20/20 1317 98.2 F (36.8 C)     Temp Source 03/20/20 1317 Oral     SpO2 03/20/20 1317 98 %     Weight 03/20/20 1318 136 lb 11 oz (62 kg)     Height 03/20/20 1318 $RemoveBefor'5\' 2"'[id number]$  (1.575 m)     Head Circumference --      Peak Flow --      Pain Score 03/20/20 1318 2     Pain Loc --      Pain Edu? --      Excl. in [place]? --    Constitutional: Alert and oriented. Well appearing and in no distress. Eyes: Normal exam ENT       Head: Normocephalic and atraumatic.      Mouth/Throat: Mucous membranes are moist. Cardiovascular: Normal rate, regular rhythm. No murmur Respiratory: Normal respiratory effort without tachypnea nor retractions. Breath sounds are clear Gastrointestinal: Soft and nontender. No distention.   Musculoskeletal: Nontender with normal range of motion in all extremities. Neurologic:  Normal speech and language. No gross focal neurologic deficits Skin:  Skin is warm, dry and intact.  Psychiatric: Mood and affect are normal.   ____________________________________________   RADIOLOGY  CT  scan of the head is negative. Left hip x-ray/pelvis x-ray negative.  ____________________________________________   INITIAL IMPRESSION / ASSESSMENT AND PLAN / ED COURSE  Pertinent labs & imaging results that were available during my care of the patient were reviewed by me and considered in my medical decision making (see chart for details).   Patient presents to the emergency department for a fall with left hip pain.  Overall patient appears well, no distress calm cooperative pleasant.  Patient has great range of motion in both lower extremities without any pain elicited, nontender hip to palpation, neurovascularly intact distally.  However given the report of hitting her head as well as of left hip pain when ambulating we will obtain a CT scan of the head as a precaution as well as a left hip and pelvis x-ray to further evaluate.  Patient agreeable to plan of care.  Patient's imaging including CT scan of the head, pelvis x-ray and left hip x-ray are negative for acute abnormality.  Believe patient can safely be discharged home with supportive care such as Tylenol and rest.  Paige Farmer was evaluated in Emergency Department on 03/20/2020 for the symptoms described in the history of present illness. She was evaluated in the context of the global COVID-19 pandemic, which necessitated consideration that the patient  might be at risk for infection with the SARS-CoV-2 virus that causes COVID-19. Institutional protocols and algorithms that pertain to the evaluation of patients at risk for COVID-19 are in a state of rapid change based on information released by regulatory bodies including the CDC and federal and state organizations. These policies and algorithms were followed during the patient's care in the ED.  ____________________________________________   FINAL CLINICAL IMPRESSION(S) / ED DIAGNOSES  Fall Musculoskeletal pain   Harvest Dark, MD 03/20/20 1411

## 2020-03-20 NOTE — ED Notes (Signed)
Pt discharged with son back to SNF

## 2020-05-27 ENCOUNTER — Ambulatory Visit: Payer: Medicare Other | Admitting: Internal Medicine

## 2020-05-30 ENCOUNTER — Other Ambulatory Visit: Payer: Medicare Other

## 2020-05-30 ENCOUNTER — Ambulatory Visit: Payer: Medicare Other | Admitting: Internal Medicine

## 2020-10-29 ENCOUNTER — Encounter: Payer: Self-pay | Admitting: *Deleted

## 2020-10-29 ENCOUNTER — Emergency Department: Payer: Medicare Other

## 2020-10-29 ENCOUNTER — Emergency Department
Admission: EM | Admit: 2020-10-29 | Discharge: 2020-10-30 | Disposition: A | Discharge status: Skilled Nursing Facility | Payer: Medicare Other | Attending: Emergency Medicine | Admitting: Emergency Medicine

## 2020-10-29 ENCOUNTER — Other Ambulatory Visit: Payer: Self-pay

## 2020-10-29 DIAGNOSIS — Z79899 Other long term (current) drug therapy: Secondary | ICD-10-CM | POA: Insufficient documentation

## 2020-10-29 DIAGNOSIS — Z85850 Personal history of malignant neoplasm of thyroid: Secondary | ICD-10-CM | POA: Diagnosis not present

## 2020-10-29 DIAGNOSIS — W19XXXA Unspecified fall, initial encounter: Secondary | ICD-10-CM | POA: Insufficient documentation

## 2020-10-29 DIAGNOSIS — F0390 Unspecified dementia without behavioral disturbance: Secondary | ICD-10-CM | POA: Diagnosis not present

## 2020-10-29 DIAGNOSIS — M25511 Pain in right shoulder: Secondary | ICD-10-CM | POA: Insufficient documentation

## 2020-10-29 LAB — BASIC METABOLIC PANEL
Anion gap: 7 (ref 5–15)
BUN: 28 mg/dL — ABNORMAL HIGH (ref 8–23)
CO2: 27 mmol/L (ref 22–32)
Calcium: 8.9 mg/dL (ref 8.9–10.3)
Chloride: 103 mmol/L (ref 98–111)
Creatinine, Ser: 0.88 mg/dL (ref 0.44–1.00)
GFR, Estimated: >60 mL/min (ref >60)
Glucose, Bld: 112 mg/dL — ABNORMAL HIGH (ref 70–99)
Potassium: 3.9 mmol/L (ref 3.5–5.1)
Sodium: 137 mmol/L (ref 135–145)

## 2020-10-29 LAB — CBC WITH DIFFERENTIAL/PLATELET
Abs Immature Granulocytes: 0.06 10*3/uL (ref 0.00–0.07)
Basophils Absolute: 0.1 10*3/uL (ref 0.0–0.1)
Basophils Relative: 1 %
Eosinophils Absolute: 0.2 10*3/uL (ref 0.0–0.5)
Eosinophils Relative: 1 %
HCT: 30.9 % — ABNORMAL LOW (ref 36.0–46.0)
Hemoglobin: 10.1 g/dL — ABNORMAL LOW (ref 12.0–15.0)
Immature Granulocytes: 0 %
Lymphocytes Relative: 29 %
Lymphs Abs: 4.3 10*3/uL — ABNORMAL HIGH (ref 0.7–4.0)
MCH: 29.4 pg (ref 26.0–34.0)
MCHC: 32.7 g/dL (ref 30.0–36.0)
MCV: 89.8 fL (ref 80.0–100.0)
Monocytes Absolute: 1.5 10*3/uL — ABNORMAL HIGH (ref 0.1–1.0)
Monocytes Relative: 10 %
Neutro Abs: 8.8 10*3/uL — ABNORMAL HIGH (ref 1.7–7.7)
Neutrophils Relative %: 59 %
Platelets: 293 10*3/uL (ref 150–400)
RBC: 3.44 MIL/uL — ABNORMAL LOW (ref 3.87–5.11)
RDW: 16.3 % — ABNORMAL HIGH (ref 11.5–15.5)
WBC: 14.7 10*3/uL — ABNORMAL HIGH (ref 4.0–10.5)
nRBC: 0 % (ref 0.0–0.2)

## 2020-10-29 NOTE — ED Triage Notes (Signed)
Per EMS report, patient is a resident of [facility] and had an unwitnessed fall from standing. Patient c/o right shoulder pain and back of head pain. No LOC was reported. Patient arrived oriented to self, which according to EMS is her baseline. Patient usually ambulates with one assist. Patient is on Aspirin 81mg  daily.

## 2020-10-29 NOTE — ED Notes (Signed)
Report given to Haley RN.

## 2020-10-29 NOTE — ED Provider Notes (Signed)
[facility] Emergency Department Provider Note  ____________________________________________   I have reviewed the triage vital signs and the nursing notes.   HISTORY  Chief Complaint Fall   History limited by and level 5 caveat due to: Dementia   HPI Paige Farmer is a 87 y.o. female who presents to the emergency department today after an unwitnessed fall.  The patient cannot give a good history as to why she is although she states she does remember falling.  Patient primarily has complaints of right shoulder pain.  Patient denies any chest pain.  Denies any head ache or neck pain at the time my exam.  Patient denies any nausea or vomiting.   Records reviewed. Per medical record review patient has a history of dementia.   Past Medical History:  Diagnosis Date  . Cancer ([place])    Thyroid  . Dementia ([place])   . Depression   . Glaucoma   . H/O: depression   . History of chicken pox   . Palpitations   . Thyroid cancer ([place])   . Thyroid disease     Patient Active Problem List   Diagnosis Date Noted  . Infection of left tear duct 05/30/2019  . Memory change 04/20/2016  . Elevated blood pressure reading 04/20/2016  . Hyperglycemia 10/03/2015  . Osteopenia 07/18/2015  . Disease of thyroid gland 07/18/2015  . Loss of weight 04/16/2015  . Health care maintenance 01/13/2015  . Beat, premature ventricular 09/11/2014  . Abdominal cramping 08/09/2014  . Acute diarrhea 08/09/2014  . Abnormal liver enzymes 08/09/2014  . Lump in neck 05/19/2014  . Localized swelling, mass, and lump of head 05/19/2014  . Abnormal liver function tests 05/14/2014  . Edema 05/14/2014  . Abnormal blood chemistry 05/14/2014  . Hypercholesterolemia 03/14/2014  . Pure hypercholesterolemia 03/14/2014  . Constipation 01/04/2014  . Anemia 01/04/2014  . Palpitations 10/01/2013  . Glaucoma 10/01/2013  . Depression 10/01/2013  . Head pain 10/01/2013  . Right hip pain 10/01/2013   . Cephalalgia 10/01/2013  . Malignant neoplasm of thyroid gland ([name]) 10/01/2013  . Arthralgia of hip or thigh 10/01/2013  . Degenerative arthritis of hip 07/28/2012  . HZV (herpes zoster virus) post herpetic neuralgia 03/23/2012  . Fatigue 12/22/2011    Past Surgical History:  Procedure Laterality Date  . ABDOMINAL HYSTERECTOMY    . APPENDECTOMY    . LAPAROSCOPIC HYSTERECTOMY  1970   ovaries left in place  . THYROIDECTOMY    . TONSILLECTOMY  1952    Prior to Admission medications   Medication Sig Start Date End Date Taking? Authorizing Provider  acetaminophen (TYLENOL) 325 MG tablet Take 2 tablets (650 mg total) by mouth every 6 (six) hours as needed for moderate pain. 03/20/20 03/20/21  Harvest Dark, MD  ASPIRIN LOW DOSE 81 MG EC tablet TAKE 1 TABLET BY MOUTH ONCE DAILY Patient taking differently: Take 81 mg by mouth daily.  09/01/19   Einar Pheasant, MD  busPIRone (BUSPAR) 10 MG tablet TAKE 1 TABLET BY MOUTH TWICE A DAY Patient taking differently: Take 10 mg by mouth 2 (two) times daily.  09/01/19   Einar Pheasant, MD  dorzolamide-timolol (COSOPT) 22.3-6.8 MG/ML ophthalmic solution INSTILL 1 DROP IN [facility] EYE TWICE DAILY Patient taking differently: Place 1 drop into both eyes 2 (two) times daily.  09/01/19   Einar Pheasant, MD  hydrocortisone 2.5 % ointment Apply 1 application topically 2 (two) times daily. 11/22/19   [provider]  levothyroxine (SYNTHROID) 75 MCG tablet  TAKE 1 TABLET BY MOUTH AT BEDTIME ON AN EMPTY STOMACH Patient taking differently: Take 75 mcg by mouth at bedtime. TAKE 1 TABLET BY MOUTH AT BEDTIME ON AN EMPTY STOMACH 09/01/19   Einar Pheasant, MD  liothyronine (CYTOMEL) 5 MCG tablet TAKE 1 & 1/2 TABLETS BY MOUTH ONCE DAILY Patient taking differently: Take 7.5 mcg by mouth daily. TAKE 1 & 1/2 TABLETS (7.5 mcg) BY MOUTH ONCE DAILY 09/01/19   Einar Pheasant, MD  melatonin 3 MG TABS tablet Take 6 mg by mouth at bedtime.     [provider]   Multiple Vitamins-Minerals (CERTAVITE SENIOR) TABS TAKE 1 TABLET BY MOUTH AT BEDTIME 09/01/19   Einar Pheasant, MD  Multiple Vitamins-Minerals (PRESERVISION AREDS) TABS TAKE 1 TABLET BY MOUTH TWICE A DAY 09/01/19   Einar Pheasant, MD  naproxen sodium (ALEVE) 220 MG tablet Take 2 tablets (440 mg total) by mouth 2 (two) times daily as needed. 12/23/19   Venia Carbon, MD  sertraline (ZOLOFT) 100 MG tablet TAKE 1 TABLET BY MOUTH ONCE DAILY Patient taking differently: Take 100 mg by mouth daily.  09/01/19   Einar Pheasant, MD  traMADol (ULTRAM) 50 MG tablet Take 1 tablet (50 mg total) by mouth every 6 (six) hours as needed. 03/20/20   Harvest Dark, MD    Allergies Doxepin  Family History  Problem Relation Age of Onset  . Cancer Mother        multiple myeloma  . Parkinson's disease Father   . Cancer - Lung Brother        x 2  . Breast cancer Neg Hx     Social History Social History   Tobacco Use  . Smoking status: Never Smoker  . Smokeless tobacco: Never Used  Vaping Use  . Vaping Use: Never used  Substance Use Topics  . Alcohol use: No    Alcohol/week: 0.0 standard drinks  . Drug use: No    Review of Systems reliability of ROS somewhat questioned given dementia.  Constitutional: No fever/chills Eyes: No visual changes. ENT: No sore throat. Cardiovascular: Denies chest pain. Respiratory: Denies shortness of breath. Gastrointestinal: No abdominal pain.  No nausea, no vomiting.  No diarrhea.   Genitourinary: Negative for dysuria. Musculoskeletal: Positive for right shoulder pain. Skin: Negative for rash. Neurological: Negative for headaches, focal weakness or numbness.  ____________________________________________   PHYSICAL EXAM:  VITAL SIGNS: ED Triage Vitals  Enc Vitals Group     BP 10/29/20 2148 (!) 164/79     Pulse Rate 10/29/20 2148 72     Resp 10/29/20 2148 16     Temp 10/29/20 2148 98.2 F (36.8 C)     Temp Source 10/29/20 2148 Oral     SpO2  10/29/20 2148 99 %     Weight 10/29/20 2151 135 lb 9.6 oz (61.5 kg)    Constitutional: Awake and alert. Not completely oriented.  Eyes: Conjunctivae are normal.  ENT      Head: Normocephalic and atraumatic.      Nose: No congestion/rhinnorhea.      Mouth/Throat: Mucous membranes are moist.      Neck: No stridor. No midline tenderness.  Hematological/Lymphatic/Immunilogical: No cervical lymphadenopathy. Cardiovascular: Normal rate, regular rhythm.  No murmurs, rubs, or gallops.  Respiratory: Normal respiratory effort without tachypnea nor retractions. Breath sounds are clear and equal bilaterally. No wheezes/rales/rhonchi. Gastrointestinal: Soft and non tender. No rebound. No guarding.  Genitourinary: Deferred Musculoskeletal: Normal range of motion in all extremities.  Right shoulder without any deformity  or tenderness to palpation.  Patient without any tenderness to passive range of motion of the right shoulder.  No tenderness to the right extremity. Neurologic:  Normal speech and language. No gross focal neurologic deficits are appreciated.  Skin:  Skin is warm, dry and intact. No rash noted. Psychiatric: Mood and affect are normal. Speech and behavior are normal. Patient exhibits appropriate insight and judgment.  ____________________________________________    LABS (pertinent positives/negatives)  CBC wbc 14.7, hgb 10.1, plt 293  ____________________________________________   EKG  I, Nance Pear, attending physician, personally viewed and interpreted this EKG  EKG Time: 2147 Rate: 72 Rhythm: sinus rhythm Axis: left axis deviation Intervals: qtc 445 QRS: narrow ST changes: no st elevation Impression: normal ekg  ____________________________________________    RADIOLOGY  Right shoulder x-ray No acute abnormality  CT head/cervical spine No acute intracranial abnormality or osseous  abnormality ____________________________________________   PROCEDURES  Procedures  ____________________________________________   INITIAL IMPRESSION / ASSESSMENT AND PLAN / ED COURSE  Pertinent labs & imaging results that were available during my care of the patient were reviewed by me and considered in my medical decision making (see chart for details).   Patient presents to the emergency department today after an unwitnessed fall.  Patient is primarily complaining of right shoulder pain.  On exam however patient without any tenderness to the right upper extremity or right shoulder.  Will however get x-ray.  Additionally will check some blood work and urine to evaluate for possible causes of weakness that might of related to the patient's fall given that it was unwitnessed.  Given unwitnessed fall will get CT head and cervical spine.   CT head and cervical spine without concerning traumatic findings.  Right shoulder without concerning findings.  CBC with slight leukocytosis of  unclear etiology at this time.  Slight anemia however patient without any tachycardia or hypotension that would signify acute blood loss. Awaiting BMP and urine at time of sign out.    ____________________________________________   FINAL CLINICAL IMPRESSION(S) / ED DIAGNOSES  Fall Right shoulder pain  Note: This dictation was prepared with Dragon dictation. Any transcriptional errors that result from this process are unintentional     Nance Pear, MD 10/29/20 2336

## 2020-10-29 NOTE — ED Notes (Signed)
Update given to patient's son at bedside.

## 2020-10-30 LAB — URINALYSIS, COMPLETE (UACMP) WITH MICROSCOPIC
Bacteria, UA: NONE SEEN
Bilirubin Urine: NEGATIVE
Glucose, UA: NEGATIVE mg/dL
Hgb urine dipstick: NEGATIVE
Ketones, ur: NEGATIVE mg/dL
Nitrite: NEGATIVE
Protein, ur: NEGATIVE mg/dL
Specific Gravity, Urine: 1.02 (ref 1.005–1.030)
Squamous Epithelial / HPF: NONE SEEN (ref 0–5)
pH: 5 (ref 5.0–8.0)

## 2020-10-30 NOTE — Discharge Instructions (Addendum)

## 2020-10-30 NOTE — ED Provider Notes (Signed)
Accepted care of this patient from Dr. Archie Balboa at 12 AM pending BMP and a UA.  Both are unremarkable at this time.  Patient does have an elevated white count of 14.7 with a left shift but no signs of infection based on history and physical exam.  I did review her imaging which showed no signs of acute traumatic injury.  Patient has no tenderness and looks well-appearing on exam.  Stable for discharge according to plan left by Dr. Archie Balboa.    I have personally reviewed the images performed during this visit and I agree with the Radiologist's read.   Interpretation by Radiologist:  DG Shoulder Right  Result Date: 10/29/2020 CLINICAL DATA:  Right shoulder pain after fall. EXAM: RIGHT SHOULDER - 2+ VIEW COMPARISON:  None. FINDINGS: There is no evidence of fracture or dislocation. Normal alignment. There is no evidence of arthropathy or other focal bone abnormality. Soft tissues are unremarkable. Included right ribs are intact. IMPRESSION: Negative radiographs of the right shoulder. Electronically Signed   By: Keith Rake M.D.   On: 10/29/2020 22:58   CT Head Wo Contrast  Result Date: 10/29/2020 CLINICAL DATA:  Unwitnessed fall from standing. EXAM: CT HEAD WITHOUT CONTRAST TECHNIQUE: Contiguous axial images were obtained from the base of the skull through the vertex without intravenous contrast. COMPARISON:  Most recent head CT 03/20/2020 FINDINGS: Brain: Stable atrophy and chronic small vessel ischemia. No intracranial hemorrhage, mass effect, or midline shift. No hydrocephalus. The basilar cisterns are patent. No evidence of territorial infarct or acute ischemia. No extra-axial or intracranial fluid collection. Vascular: Atherosclerosis of skullbase vasculature without hyperdense vessel or abnormal calcification. Skull: No fracture or focal lesion. Sinuses/Orbits: Paranasal sinuses and mastoid air cells are clear. The visualized orbits are unremarkable. Bilateral cataract resection. Other: None.  IMPRESSION: 1. No acute intracranial abnormality. No skull fracture. 2. Stable atrophy and chronic small vessel ischemia. Electronically Signed   By: Keith Rake M.D.   On: 10/29/2020 22:51   CT Cervical Spine Wo Contrast  Result Date: 10/29/2020 CLINICAL DATA:  Unwitnessed fall. EXAM: CT CERVICAL SPINE WITHOUT CONTRAST TECHNIQUE: Multidetector CT imaging of the cervical spine was performed without intravenous contrast. Multiplanar CT image reconstructions were also generated. COMPARISON:  03/14/2020 FINDINGS: Alignment: Stable from prior, no traumatic subluxation. Trace anterolisthesis of C6 on C7 unchanged. Skull base and vertebrae: No acute fracture. Vertebral body heights are maintained. The dens and skull base are intact. Soft tissues and spinal canal: No prevertebral fluid or swelling. No visible canal hematoma. Disc levels: Mild diffuse degenerative disc disease, most prominently affecting C3-C4 and C6-C7. Scattered facet hypertrophy. No high-grade canal stenosis. Upper chest: No acute or unexpected findings. Other: Carotid calcifications. IMPRESSION: Degenerative change in the cervical spine without acute fracture or subluxation. Electronically Signed   By: Keith Rake M.D.   On: 10/29/2020 22:55      Rudene Re, MD 10/30/20 [id number]

## 2020-11-01 ENCOUNTER — Emergency Department

## 2020-11-01 ENCOUNTER — Emergency Department
Admission: EM | Admit: 2020-11-01 | Discharge: 2020-11-01 | Disposition: A | Discharge status: Home / Self Care | Attending: Emergency Medicine | Admitting: Emergency Medicine

## 2020-11-01 ENCOUNTER — Other Ambulatory Visit: Payer: Self-pay

## 2020-11-01 DIAGNOSIS — F0390 Unspecified dementia without behavioral disturbance: Secondary | ICD-10-CM | POA: Diagnosis not present

## 2020-11-01 DIAGNOSIS — W19XXXA Unspecified fall, initial encounter: Secondary | ICD-10-CM | POA: Diagnosis not present

## 2020-11-01 DIAGNOSIS — Z85850 Personal history of malignant neoplasm of thyroid: Secondary | ICD-10-CM | POA: Insufficient documentation

## 2020-11-01 DIAGNOSIS — N390 Urinary tract infection, site not specified: Secondary | ICD-10-CM | POA: Insufficient documentation

## 2020-11-01 DIAGNOSIS — Z7982 Long term (current) use of aspirin: Secondary | ICD-10-CM | POA: Diagnosis not present

## 2020-11-01 DIAGNOSIS — R82998 Other abnormal findings in urine: Secondary | ICD-10-CM | POA: Diagnosis present

## 2020-11-01 LAB — URINALYSIS, COMPLETE (UACMP) WITH MICROSCOPIC
Bilirubin Urine: NEGATIVE
Glucose, UA: NEGATIVE mg/dL
Hgb urine dipstick: NEGATIVE
Ketones, ur: NEGATIVE mg/dL
Nitrite: NEGATIVE
Protein, ur: 30 mg/dL — AB
Specific Gravity, Urine: 1.024 (ref 1.005–1.030)
Squamous Epithelial / HPF: NONE SEEN (ref 0–5)
pH: 8 (ref 5.0–8.0)

## 2020-11-01 MED ORDER — SODIUM CHLORIDE 0.9 % IV SOLN
2.0000 g | Freq: Once | INTRAVENOUS | Status: AC
  Administered 2020-11-01: 2 g via INTRAVENOUS
  Filled 2020-11-01: qty 20

## 2020-11-01 MED ORDER — CEPHALEXIN 500 MG PO CAPS: 500.0000 mg | ORAL_CAPSULE | Freq: Three times a day (TID) | ORAL | 0 refills | Status: AC

## 2020-11-01 MED ORDER — SODIUM CHLORIDE 0.9 % IV BOLUS
1000.0000 mL | Freq: Once | INTRAVENOUS | Status: AC
  Administered 2020-11-01: 1000 mL via INTRAVENOUS

## 2020-11-01 NOTE — ED Notes (Signed)
Bruising on right buttocks. Patient cleaned up and placed on chuck and purewick in place.

## 2020-11-01 NOTE — ED Notes (Signed)
Pt son at bedside requesting to speak with MD. Pt son, and POA, states that pt was supposed to be moved to hospice today and that he was not aware she was being transported to the ER. Pt son states that he does not want pt to be treated unless necessary. EDP Isaacs aware pt son would like to speak with him.

## 2020-11-01 NOTE — ED Triage Notes (Signed)
BIB [facility]. Staff found between bed and nightstand. Coming from [place]. Pain in neck, left side. Doesnt communicate a lot and not very active. Patient is dementia patient. EMS advised patient smelled strongly of urine.  V/S BGL 111 97.6 154/64 96% RA 80 HR

## 2020-11-01 NOTE — ED Notes (Signed)
Unable to collect labs from IV. Stuck second time with butterfly and unable to pull labs.

## 2020-11-01 NOTE — ED Provider Notes (Signed)
[facility] Emergency Department Provider Note  ____________________________________________   Event Date/Time   First MD Initiated Contact with Patient 11/01/20 [phone]     (approximate)  I have reviewed the triage vital signs and the nursing notes.   HISTORY  Chief Complaint Fall    HPI Paige Farmer is a 87 y.o. female  Here with unwitnessed fall. Pt was found down in her room today at [facility]. Pt is demented, unable to provide additional history. Recent ED visit reviewed. Pt was just seen on 5/11 for fall, had leukocytosis on labs but o/w no apparent infection or other emergent pathology. Imaging at that time was unremarkable as well. No lacerations. Staff does not report any other acute changes.  Level 5 caveat invoked as remainder of history, ROS, and physical exam limited due to patient's dementia.         Past Medical History:  Diagnosis Date  . Cancer (HCC)    Thyroid  . Dementia (HCC)   . Depression   . Glaucoma   . H/O: depression   . History of chicken pox   . Palpitations   . Thyroid cancer (HCC)   . Thyroid disease     Patient Active Problem List   Diagnosis Date Noted  . Infection of left tear duct 05/30/2019  . Memory change 04/20/2016  . Elevated blood pressure reading 04/20/2016  . Hyperglycemia 10/03/2015  . Osteopenia 07/18/2015  . Disease of thyroid gland 07/18/2015  . Loss of weight 04/16/2015  . Health care maintenance 01/13/2015  . Beat, premature ventricular 09/11/2014  . Abdominal cramping 08/09/2014  . Acute diarrhea 08/09/2014  . Abnormal liver enzymes 08/09/2014  . Lump in neck 05/19/2014  . Localized swelling, mass, and lump of head 05/19/2014  . Abnormal liver function tests 05/14/2014  . Edema 05/14/2014  . Abnormal blood chemistry 05/14/2014  . Hypercholesterolemia 03/14/2014  . Pure hypercholesterolemia 03/14/2014  . Constipation 01/04/2014  . Anemia 01/04/2014  . Palpitations 10/01/2013  .  Glaucoma 10/01/2013  . Depression 10/01/2013  . Head pain 10/01/2013  . Right hip pain 10/01/2013  . Cephalalgia 10/01/2013  . Malignant neoplasm of thyroid gland (HCC) 10/01/2013  . Arthralgia of hip or thigh 10/01/2013  . Degenerative arthritis of hip 07/28/2012  . HZV (herpes zoster virus) post herpetic neuralgia 03/23/2012  . Fatigue 12/22/2011    Past Surgical History:  Procedure Laterality Date  . ABDOMINAL HYSTERECTOMY    . APPENDECTOMY    . LAPAROSCOPIC HYSTERECTOMY  1970   ovaries left in place  . THYROIDECTOMY    . TONSILLECTOMY  1952    Prior to Admission medications   Medication Sig Start Date End Date Taking? Authorizing Provider  cephALEXin (KEFLEX) 500 MG capsule Take 1 capsule (500 mg total) by mouth 3 (three) times daily for 7 days. 11/01/20 11/08/20 Yes Shaune Pollack, MD  acetaminophen (TYLENOL) 325 MG tablet Take 2 tablets (650 mg total) by mouth every 6 (six) hours as needed for moderate pain. 03/20/20 03/20/21  Minna Antis, MD  ASPIRIN LOW DOSE 81 MG EC tablet TAKE 1 TABLET BY MOUTH ONCE DAILY Patient taking differently: Take 81 mg by mouth daily.  09/01/19   Dale Westmoreland, MD  busPIRone (BUSPAR) 10 MG tablet TAKE 1 TABLET BY MOUTH TWICE A DAY Patient taking differently: Take 10 mg by mouth 2 (two) times daily.  09/01/19   Dale [place], MD  dorzolamide-timolol (COSOPT) 22.3-6.8 MG/ML ophthalmic solution INSTILL 1 DROP IN [facility] EYE TWICE  DAILY Patient taking differently: Place 1 drop into both eyes 2 (two) times daily.  09/01/19   Dale Barlow, MD  hydrocortisone 2.5 % ointment Apply 1 application topically 2 (two) times daily. 11/22/19   [provider]  levothyroxine (SYNTHROID) 75 MCG tablet TAKE 1 TABLET BY MOUTH AT BEDTIME ON AN EMPTY STOMACH Patient taking differently: Take 75 mcg by mouth at bedtime. TAKE 1 TABLET BY MOUTH AT BEDTIME ON AN EMPTY STOMACH 09/01/19   Dale [place], MD  liothyronine (CYTOMEL) 5 MCG tablet TAKE 1 & 1/2 TABLETS  BY MOUTH ONCE DAILY Patient taking differently: Take 7.5 mcg by mouth daily. TAKE 1 & 1/2 TABLETS (7.5 mcg) BY MOUTH ONCE DAILY 09/01/19   Dale Hypoluxo, MD  melatonin 3 MG TABS tablet Take 6 mg by mouth at bedtime.     [provider]  Multiple Vitamins-Minerals (CERTAVITE SENIOR) TABS TAKE 1 TABLET BY MOUTH AT BEDTIME 09/01/19   Dale Bent, MD  Multiple Vitamins-Minerals (PRESERVISION AREDS) TABS TAKE 1 TABLET BY MOUTH TWICE A DAY 09/01/19   Dale Millington, MD  naproxen sodium (ALEVE) 220 MG tablet Take 2 tablets (440 mg total) by mouth 2 (two) times daily as needed. 12/23/19   Karie Schwalbe, MD  sertraline (ZOLOFT) 100 MG tablet TAKE 1 TABLET BY MOUTH ONCE DAILY Patient taking differently: Take 100 mg by mouth daily.  09/01/19   Dale Marbury, MD  traMADol (ULTRAM) 50 MG tablet Take 1 tablet (50 mg total) by mouth every 6 (six) hours as needed. 03/20/20   Minna Antis, MD    Allergies Doxepin  Family History  Problem Relation Age of Onset  . Cancer Mother        multiple myeloma  . Parkinson's disease Father   . Cancer - Lung Brother        x 2  . Breast cancer Neg Hx     Social History Social History   Tobacco Use  . Smoking status: Never Smoker  . Smokeless tobacco: Never Used  Vaping Use  . Vaping Use: Never used  Substance Use Topics  . Alcohol use: No    Alcohol/week: 0.0 standard drinks  . Drug use: No    Review of Systems  Review of Systems  Unable to perform ROS: Dementia     ____________________________________________  PHYSICAL EXAM:      VITAL SIGNS: ED Triage Vitals  Enc Vitals Group     BP 11/01/20 0833 (!) 168/62     Pulse Rate 11/01/20 0833 79     Resp 11/01/20 0833 17     Temp 11/01/20 0833 98 F (36.7 C)     Temp Source 11/01/20 0833 Oral     SpO2 11/01/20 0832 95 %     Weight --      Height --      Head Circumference --      Peak Flow --      Pain Score --      Pain Loc --      Pain Edu? --      Excl. in GC? --       Physical Exam Vitals and nursing note reviewed.  Constitutional:      General: She is not in acute distress.    Appearance: She is well-developed.  HENT:     Head: Normocephalic and atraumatic.  Eyes:     Conjunctiva/sclera: Conjunctivae normal.  Cardiovascular:     Rate and Rhythm: Normal rate and regular rhythm.  Heart sounds: Normal heart sounds.  Pulmonary:     Effort: Pulmonary effort is normal. No respiratory distress.     Breath sounds: No wheezing.  Abdominal:     General: There is no distension.  Musculoskeletal:     Cervical back: Neck supple.  Skin:    General: Skin is warm.     Capillary Refill: Capillary refill takes less than 2 seconds.     Findings: No rash.  Neurological:     Mental Status: She is alert and oriented to person, place, and time.     Motor: No abnormal muscle tone.       ____________________________________________   LABS (all labs ordered are listed, but only abnormal results are displayed)  Labs Reviewed  URINALYSIS, COMPLETE (UACMP) WITH MICROSCOPIC - Abnormal; Notable for the following components:      Result Value   Color, Urine YELLOW (*)    APPearance CLOUDY (*)    Protein, ur 30 (*)    Leukocytes,Ua TRACE (*)    Bacteria, UA FEW (*)    All other components within normal limits  URINE CULTURE  TROPONIN I (HIGH SENSITIVITY)    ____________________________________________  EKG: Normal sinus rhythm, VR 79. PR 167, QRS 82, QTc 459. No acute St elevations or depressions. No ekg evidence of acute ischemia or infarct. ________________________________________  RADIOLOGY All imaging, including plain films, CT scans, and ultrasounds, independently reviewed by me, and interpretations confirmed via formal radiology reads.  ED MD interpretation:     Official radiology report(s): No results found.  ____________________________________________  PROCEDURES   Procedure(s) performed (including Critical  Care):  Procedures  ____________________________________________  INITIAL IMPRESSION / MDM / [place] / ED COURSE  As part of my medical decision making, I reviewed the following data within the [facility] notes reviewed and incorporated, Old chart reviewed, Notes from prior ED visits, and [place] Controlled Substance Database       *Paige Farmer was evaluated in Emergency Department on 11/01/2020 for the symptoms described in the history of present illness. She was evaluated in the context of the global COVID-19 pandemic, which necessitated consideration that the patient might be at risk for infection with the SARS-CoV-2 virus that causes COVID-19. Institutional protocols and algorithms that pertain to the evaluation of patients at risk for COVID-19 are in a state of rapid change based on information released by regulatory bodies including the CDC and federal and state organizations. These policies and algorithms were followed during the patient's care in the ED.  Some ED evaluations and interventions may be delayed as a result of limited staffing during the pandemic.*     Medical Decision Making: 87 year old female here with recurrent falls.  Patient's son is present on arrival.  Patient is currently being set up and due to start hospice at her facility today.  He would like to minimize intervention or medical work-up.  At this time, I see no significant evidence to suggest acute trauma.  She has no tenderness on full secondary exam.  No evidence of head trauma.  Patient does have recent increased fall so urinalysis was sent and shows possible UTI.  He is amenable to treating this.  She was given a dose of Rocephin here.  Otherwise, patient was a difficult stick and he would like to avoid additional needle sticks which I think is very reasonable.  She does not appear septic.  She just had lab work several days ago.  Will start on  empiric antibiotics and have her  follow-up with hospice.  ____________________________________________  FINAL CLINICAL IMPRESSION(S) / ED DIAGNOSES  Final diagnoses:  Fall, initial encounter  Lower urinary tract infectious disease     MEDICATIONS GIVEN DURING THIS VISIT:  Medications  cefTRIAXone (ROCEPHIN) 2 g in sodium chloride 0.9 % 100 mL IVPB (2 g Intravenous New Bag/Given 11/01/20 0918)  sodium chloride 0.9 % bolus 1,000 mL (1,000 mLs Intravenous New Bag/Given 11/01/20 1901)     ED Discharge Orders         Ordered    cephALEXin (KEFLEX) 500 MG capsule  3 times daily        11/01/20 2224           Note:  This document was prepared using Dragon voice recognition software and may include unintentional dictation errors.   Duffy Bruce, MD 11/01/20 [phone]

## 2020-11-03 LAB — URINE CULTURE: Culture: 50000 — AB

## 2020-11-20 ENCOUNTER — Other Ambulatory Visit: Payer: Self-pay

## 2020-11-20 ENCOUNTER — Emergency Department

## 2020-11-20 ENCOUNTER — Emergency Department
Admission: EM | Admit: 2020-11-20 | Discharge: 2020-11-21 | Disposition: A | Discharge status: Home / Self Care | Attending: Emergency Medicine | Admitting: Emergency Medicine

## 2020-11-20 DIAGNOSIS — F0390 Unspecified dementia without behavioral disturbance: Secondary | ICD-10-CM | POA: Diagnosis present

## 2020-11-20 DIAGNOSIS — Z7982 Long term (current) use of aspirin: Secondary | ICD-10-CM | POA: Diagnosis not present

## 2020-11-20 DIAGNOSIS — W19XXXA Unspecified fall, initial encounter: Secondary | ICD-10-CM | POA: Insufficient documentation

## 2020-11-20 DIAGNOSIS — Z85850 Personal history of malignant neoplasm of thyroid: Secondary | ICD-10-CM | POA: Diagnosis not present

## 2020-11-20 DIAGNOSIS — M7989 Other specified soft tissue disorders: Secondary | ICD-10-CM | POA: Diagnosis not present

## 2020-11-20 MED ORDER — ACETAMINOPHEN 500 MG PO TABS
1000.0000 mg | ORAL_TABLET | Freq: Once | ORAL | Status: AC
  Administered 2020-11-21: 1000 mg via ORAL
  Filled 2020-11-20: qty 2

## 2020-11-20 NOTE — ED Provider Notes (Signed)
[facility] Emergency Department Provider Note  ____________________________________________   Event Date/Time   First MD Initiated Contact with Patient 11/20/20 2158     (approximate)  I have reviewed the triage vital signs and the nursing notes.   HISTORY  Chief Complaint Fall    HPI Paige Farmer is a 87 y.o. female with dementia who comes in for a fall.  Patient with dementia with unwitnessed fall who comes in given she was concerned of having a headache and may be some right hip pain.  [facility] unit.  On aspirin 81.  Patient is denying any symptoms at this time but history is limited secondary to dementia.          Past Medical History:  Diagnosis Date  . Cancer ([place])    Thyroid  . Dementia ([place])   . Depression   . Glaucoma   . H/O: depression   . History of chicken pox   . Palpitations   . Thyroid cancer ([place])   . Thyroid disease     Patient Active Problem List   Diagnosis Date Noted  . Infection of left tear duct 05/30/2019  . Memory change 04/20/2016  . Elevated blood pressure reading 04/20/2016  . Hyperglycemia 10/03/2015  . Osteopenia 07/18/2015  . Disease of thyroid gland 07/18/2015  . Loss of weight 04/16/2015  . Health care maintenance 01/13/2015  . Beat, premature ventricular 09/11/2014  . Abdominal cramping 08/09/2014  . Acute diarrhea 08/09/2014  . Abnormal liver enzymes 08/09/2014  . Lump in neck 05/19/2014  . Localized swelling, mass, and lump of head 05/19/2014  . Abnormal liver function tests 05/14/2014  . Edema 05/14/2014  . Abnormal blood chemistry 05/14/2014  . Hypercholesterolemia 03/14/2014  . Pure hypercholesterolemia 03/14/2014  . Constipation 01/04/2014  . Anemia 01/04/2014  . Palpitations 10/01/2013  . Glaucoma 10/01/2013  . Depression 10/01/2013  . Head pain 10/01/2013  . Right hip pain 10/01/2013  . Cephalalgia 10/01/2013  . Malignant neoplasm of thyroid gland  ([place]) 10/01/2013  . Arthralgia of hip or thigh 10/01/2013  . Degenerative arthritis of hip 07/28/2012  . HZV (herpes zoster virus) post herpetic neuralgia 03/23/2012  . Fatigue 12/22/2011    Past Surgical History:  Procedure Laterality Date  . ABDOMINAL HYSTERECTOMY    . APPENDECTOMY    . LAPAROSCOPIC HYSTERECTOMY  1970   ovaries left in place  . THYROIDECTOMY    . TONSILLECTOMY  1952    Prior to Admission medications   Medication Sig Start Date End Date Taking? Authorizing Provider  acetaminophen (TYLENOL) 325 MG tablet Take 2 tablets (650 mg total) by mouth every 6 (six) hours as needed for moderate pain. 03/20/20 03/20/21  Harvest Dark, MD  ASPIRIN LOW DOSE 81 MG EC tablet TAKE 1 TABLET BY MOUTH ONCE DAILY Patient taking differently: Take 81 mg by mouth daily.  09/01/19   Einar Pheasant, MD  busPIRone (BUSPAR) 10 MG tablet TAKE 1 TABLET BY MOUTH TWICE A DAY Patient taking differently: Take 10 mg by mouth 2 (two) times daily.  09/01/19   Einar Pheasant, MD  dorzolamide-timolol (COSOPT) 22.3-6.8 MG/ML ophthalmic solution INSTILL 1 DROP IN [facility] EYE TWICE DAILY Patient taking differently: Place 1 drop into both eyes 2 (two) times daily.  09/01/19   Einar Pheasant, MD  hydrocortisone 2.5 % ointment Apply 1 application topically 2 (two) times daily. 11/22/19   [provider]  levothyroxine (SYNTHROID) 75 MCG tablet TAKE 1 TABLET BY  MOUTH AT BEDTIME ON AN EMPTY STOMACH Patient taking differently: Take 75 mcg by mouth at bedtime. TAKE 1 TABLET BY MOUTH AT BEDTIME ON AN EMPTY STOMACH 09/01/19   Einar Pheasant, MD  liothyronine (CYTOMEL) 5 MCG tablet TAKE 1 & 1/2 TABLETS BY MOUTH ONCE DAILY Patient taking differently: Take 7.5 mcg by mouth daily. TAKE 1 & 1/2 TABLETS (7.5 mcg) BY MOUTH ONCE DAILY 09/01/19   Einar Pheasant, MD  melatonin 3 MG TABS tablet Take 6 mg by mouth at bedtime.     [provider]  Multiple Vitamins-Minerals (CERTAVITE SENIOR) TABS TAKE 1 TABLET BY  MOUTH AT BEDTIME 09/01/19   Einar Pheasant, MD  Multiple Vitamins-Minerals (PRESERVISION AREDS) TABS TAKE 1 TABLET BY MOUTH TWICE A DAY 09/01/19   Einar Pheasant, MD  naproxen sodium (ALEVE) 220 MG tablet Take 2 tablets (440 mg total) by mouth 2 (two) times daily as needed. 12/23/19   Venia Carbon, MD  sertraline (ZOLOFT) 100 MG tablet TAKE 1 TABLET BY MOUTH ONCE DAILY Patient taking differently: Take 100 mg by mouth daily.  09/01/19   Einar Pheasant, MD  traMADol (ULTRAM) 50 MG tablet Take 1 tablet (50 mg total) by mouth every 6 (six) hours as needed. 03/20/20   Harvest Dark, MD    Allergies Doxepin  Family History  Problem Relation Age of Onset  . Cancer Mother        multiple myeloma  . Parkinson's disease Father   . Cancer - Lung Brother        x 2  . Breast cancer Neg Hx     Social History Social History   Tobacco Use  . Smoking status: Never Smoker  . Smokeless tobacco: Never Used  Vaping Use  . Vaping Use: Never used  Substance Use Topics  . Alcohol use: No    Alcohol/week: 0.0 standard drinks  . Drug use: No      Review of Systems Unable to get review of systems due to dementia ____________________________________________   PHYSICAL EXAM:  VITAL SIGNS: Blood pressure (!) 154/66, pulse 79, temperature (!) 97.5 F (36.4 C), resp. rate 17, SpO2 100 %.  Constitutional: Pleasantly awake denies any concern Eyes: Conjunctivae are normal. EOMI. Head: Atraumatic. Nose: No congestion/rhinnorhea. Mouth/Throat: Mucous membranes are moist.   Neck: No stridor. Trachea Midline. FROM Cardiovascular: Normal rate, regular rhythm. Grossly normal heart sounds.  Good peripheral circulation.  No chest wall tenderness Respiratory: Normal respiratory effort.  No retractions. Lungs CTAB. Gastrointestinal: Soft and nontender. No distention. No abdominal bruits.  No abdominal tenderness Musculoskeletal: Swelling noted to bilateral legs (baseline per son) with difficulty  lifting up bilateral legs but when I range them she does not seem to have any pain Neurologic:  Normal speech and language. No gross focal neurologic deficits are appreciated.  Skin:  Skin is warm, dry and intact. No rash noted. Psychiatric: Mood and affect are normal. Speech and behavior are normal. GU: Deferred   ____RADIOLOGY I, Vanessa [place], personally viewed and evaluated these images (plain radiographs) as part of my medical decision making, as well as reviewing the written report by the radiologist.  ED MD interpretation: X-rays no fractures  Official radiology report(s): CT Head Wo Contrast  Result Date: 11/20/2020 CLINICAL DATA:  Unwitnessed fall EXAM: CT HEAD WITHOUT CONTRAST CT CERVICAL SPINE WITHOUT CONTRAST TECHNIQUE: Multidetector CT imaging of the head and cervical spine was performed following the standard protocol without intravenous contrast. Multiplanar CT image reconstructions of the cervical spine were  also generated. COMPARISON:  CT 10/29/2020, 03/14/2020 FINDINGS: CT HEAD FINDINGS Brain: No acute territorial infarction, hemorrhage or intracranial mass. Mild to moderate atrophy. Extensive white matter hypodensity consistent with chronic small vessel ischemic change. Stable ventricle size. Vascular: No hyperdense vessels.  Carotid vascular calcification Skull: Normal. Negative for fracture or focal lesion. Sinuses/Orbits: No acute finding. Other: Small right forehead soft tissue swelling CT CERVICAL SPINE FINDINGS Alignment: Exaggerated cervical lordosis. Stable alignment. Facet alignment within normal limits. Skull base and vertebrae: No acute fracture. No primary bone lesion or focal pathologic process. Soft tissues and spinal canal: No prevertebral fluid or swelling. No visible canal hematoma. Disc levels: Mild to moderate diffuse degenerative changes throughout the cervical spine, most notable at C2-C3 and C3-C4. Facet degenerative changes at multiple levels. Upper chest:  Negative. Other: None IMPRESSION: 1. No CT evidence for acute intracranial abnormality. Atrophy and chronic small vessel ischemic changes of the white matter. 2. Degenerative changes of the cervical spine. No acute osseous abnormality. Electronically Signed   By: Donavan Foil M.D.   On: 11/20/2020 22:56   CT Cervical Spine Wo Contrast  Result Date: 11/20/2020 CLINICAL DATA:  Unwitnessed fall EXAM: CT HEAD WITHOUT CONTRAST CT CERVICAL SPINE WITHOUT CONTRAST TECHNIQUE: Multidetector CT imaging of the head and cervical spine was performed following the standard protocol without intravenous contrast. Multiplanar CT image reconstructions of the cervical spine were also generated. COMPARISON:  CT 10/29/2020, 03/14/2020 FINDINGS: CT HEAD FINDINGS Brain: No acute territorial infarction, hemorrhage or intracranial mass. Mild to moderate atrophy. Extensive white matter hypodensity consistent with chronic small vessel ischemic change. Stable ventricle size. Vascular: No hyperdense vessels.  Carotid vascular calcification Skull: Normal. Negative for fracture or focal lesion. Sinuses/Orbits: No acute finding. Other: Small right forehead soft tissue swelling CT CERVICAL SPINE FINDINGS Alignment: Exaggerated cervical lordosis. Stable alignment. Facet alignment within normal limits. Skull base and vertebrae: No acute fracture. No primary bone lesion or focal pathologic process. Soft tissues and spinal canal: No prevertebral fluid or swelling. No visible canal hematoma. Disc levels: Mild to moderate diffuse degenerative changes throughout the cervical spine, most notable at C2-C3 and C3-C4. Facet degenerative changes at multiple levels. Upper chest: Negative. Other: None IMPRESSION: 1. No CT evidence for acute intracranial abnormality. Atrophy and chronic small vessel ischemic changes of the white matter. 2. Degenerative changes of the cervical spine. No acute osseous abnormality. Electronically Signed   By: Donavan Foil M.D.    On: 11/20/2020 22:56   DG Hip Unilat W or Wo Pelvis 2-3 Views Left  Result Date: 11/20/2020 CLINICAL DATA:  Un witnessed fall EXAM: DG HIP (WITH OR WITHOUT PELVIS) 2-3V LEFT COMPARISON:  03/20/2020 FINDINGS: Frontal view of the pelvis as well as frontal and frogleg lateral views of the left hip are obtained. No fracture, subluxation, or dislocation. Left hip joint space is well preserved. Sacroiliac joints are normal. Soft tissues are unremarkable. IMPRESSION: 1. Unremarkable left hip. Electronically Signed   By: Randa Ngo M.D.   On: 11/20/2020 23:17   DG Hip Unilat W or Wo Pelvis 2-3 Views Right  Result Date: 11/20/2020 CLINICAL DATA:  Un witnessed fall, right leg and hip pain EXAM: DG HIP (WITH OR WITHOUT PELVIS) 2-3V RIGHT COMPARISON:  10/20/2013 FINDINGS: Frontal view of the pelvis as well as frontal and frogleg lateral views of the right hip are obtained. No fracture, subluxation, or dislocation within either hip. Asymmetric right hip osteoarthritis with moderate joint space narrowing and osteophyte formation. Sacroiliac joints are normal. Soft tissues  are unremarkable. IMPRESSION: 1. No acute displaced fracture. 2. Right hip osteoarthritis. Electronically Signed   By: Randa Ngo M.D.   On: 11/20/2020 23:17    ____________________________________________   PROCEDURES  Procedure(s) performed (including Critical Care):  Procedures   ____________________________________________   INITIAL IMPRESSION / ASSESSMENT AND PLAN / ED COURSE  IMAAN PADGETT was evaluated in Emergency Department on 11/20/2020 for the symptoms described in the history of present illness. She was evaluated in the context of the global COVID-19 pandemic, which necessitated consideration that the patient might be at risk for infection with the SARS-CoV-2 virus that causes COVID-19. Institutional protocols and algorithms that pertain to the evaluation of patients at risk for COVID-19 are in a state of rapid change  based on information released by regulatory bodies including the CDC and federal and state organizations. These policies and algorithms were followed during the patient's care in the ED.    Patient comes in for fall will get CT head to evaluate for intercranial hemorrhage, CT cervical to evaluate for cervical fracture and x-ray to evaluate for hip fractures.  Will discuss with son for further work-up desires given previously they had wanted minimal to no work-up given patient is a hospice patient   Clinical Course as of 11/20/20 2254  Wed Nov 20, 2020  2253 [phone] hospice. Recommends xrays, avoid blood work [PS]    Clinical Course User Index [PS] Carrie Mew, MD   10:54 PM discussed with patient's son who is the POA who states that patient should never have been transported over but given that he she is now there he is okay with getting x-rays and CT scans but does not want to have any blood work done.  Reevaluated patient and updated son on results.  X-rays were negative.  Discussed the chance of an occult fracture but patient is nonambulatory at baseline is wheelchair-bound and my suspicion is low given she is able to range her hips without significant pain.  At this time do not feel further imaging is warranted.  Son also agrees and is wanting to her to get back to the facility as soon as possible.  They will have a hospice nurse come and evaluate her tomorrow to see if she needs any other additional images but at this time he would like her to be transported back.  ____________________________________________   FINAL CLINICAL IMPRESSION(S) / ED DIAGNOSES   Final diagnoses:  Fall, initial encounter  Dementia without behavioral disturbance, unspecified dementia type ([place])      MEDICATIONS GIVEN DURING THIS VISIT:  Medications  acetaminophen (TYLENOL) tablet 1,000 mg (has no administration in time range)     ED Discharge Orders    None       Note:  This document  was prepared using Dragon voice recognition software and may include unintentional dictation errors.   Vanessa [place], MD 11/21/20 [phone]

## 2020-11-20 NOTE — Discharge Instructions (Addendum)
CT scan of the head and neck were negative, x-rays were negative of the hips.  Discussed with the son who do not want aggressive interventions given she is on hospice and wanted her to be transported back to facility.

## 2020-11-20 NOTE — ED Triage Notes (Signed)
Pt coming in for an unwitnessed fall. Pt complaining of right leg/hip pain. Pt from [facility], memory care unit. Pt on 81mg  of ASA a day. Pt was complaining of head pain with facility, but not EMS.   EMS BP: 170/80  Pt unable to sign MSE due to dementia.

## 2020-11-21 NOTE — ED Notes (Signed)
[facility] called and Probation officer gave report to [employer]

## 2021-11-20 DEATH — deceased
